# Patient Record
Sex: Female | Born: 1935 | Race: Black or African American | Hispanic: No
Health system: Southern US, Community
[De-identification: ages and names within clinical notes are randomized; demographics above are authoritative.]

---

## 1999-01-21 ENCOUNTER — Encounter: Payer: Self-pay | Admitting: Cardiology

## 1999-01-21 ENCOUNTER — Inpatient Hospital Stay (HOSPITAL_COMMUNITY): Admission: EM | Admit: 1999-01-21 | Discharge: 1999-01-23 | Payer: Self-pay | Admitting: Cardiology

## 1999-05-03 ENCOUNTER — Ambulatory Visit (HOSPITAL_COMMUNITY): Admission: RE | Admit: 1999-05-03 | Discharge: 1999-05-03 | Payer: Self-pay | Admitting: Cardiology

## 1999-05-03 ENCOUNTER — Encounter: Payer: Self-pay | Admitting: Cardiology

## 2000-09-19 ENCOUNTER — Ambulatory Visit (HOSPITAL_COMMUNITY): Admission: RE | Admit: 2000-09-19 | Discharge: 2000-09-19 | Payer: Self-pay | Admitting: Cardiology

## 2000-09-19 ENCOUNTER — Encounter: Payer: Self-pay | Admitting: Cardiology

## 2001-04-13 ENCOUNTER — Encounter: Payer: Self-pay | Admitting: Family Medicine

## 2001-04-13 ENCOUNTER — Ambulatory Visit (HOSPITAL_COMMUNITY): Admission: RE | Admit: 2001-04-13 | Discharge: 2001-04-13 | Payer: Self-pay | Admitting: Family Medicine

## 2001-08-12 ENCOUNTER — Other Ambulatory Visit: Admission: RE | Admit: 2001-08-12 | Discharge: 2001-08-12 | Payer: Self-pay | Admitting: Family Medicine

## 2001-11-24 ENCOUNTER — Encounter (HOSPITAL_COMMUNITY): Admission: RE | Admit: 2001-11-24 | Discharge: 2001-12-24 | Payer: Self-pay | Admitting: Oncology

## 2001-11-24 ENCOUNTER — Encounter: Admission: RE | Admit: 2001-11-24 | Discharge: 2001-11-24 | Payer: Self-pay | Admitting: Oncology

## 2001-11-25 ENCOUNTER — Encounter (HOSPITAL_COMMUNITY): Payer: Self-pay | Admitting: Oncology

## 2002-03-18 ENCOUNTER — Encounter: Payer: Self-pay | Admitting: General Surgery

## 2002-03-19 ENCOUNTER — Encounter: Payer: Self-pay | Admitting: General Surgery

## 2002-03-19 ENCOUNTER — Ambulatory Visit (HOSPITAL_COMMUNITY): Admission: RE | Admit: 2002-03-19 | Discharge: 2002-03-19 | Payer: Self-pay | Admitting: General Surgery

## 2002-04-12 ENCOUNTER — Encounter: Payer: Self-pay | Admitting: Family Medicine

## 2002-04-12 ENCOUNTER — Ambulatory Visit (HOSPITAL_COMMUNITY): Admission: RE | Admit: 2002-04-12 | Discharge: 2002-04-12 | Payer: Self-pay | Admitting: Family Medicine

## 2002-06-09 ENCOUNTER — Encounter: Payer: Self-pay | Admitting: Cardiology

## 2002-06-09 ENCOUNTER — Ambulatory Visit (HOSPITAL_COMMUNITY): Admission: RE | Admit: 2002-06-09 | Discharge: 2002-06-09 | Payer: Self-pay | Admitting: Cardiology

## 2003-02-11 ENCOUNTER — Ambulatory Visit (HOSPITAL_COMMUNITY): Admission: RE | Admit: 2003-02-11 | Discharge: 2003-02-11 | Payer: Self-pay | Admitting: Cardiology

## 2003-02-11 ENCOUNTER — Encounter: Payer: Self-pay | Admitting: Cardiology

## 2003-04-07 ENCOUNTER — Emergency Department (HOSPITAL_COMMUNITY): Admission: EM | Admit: 2003-04-07 | Discharge: 2003-04-07 | Payer: Self-pay | Admitting: Internal Medicine

## 2003-04-07 ENCOUNTER — Encounter: Payer: Self-pay | Admitting: Internal Medicine

## 2003-04-12 ENCOUNTER — Emergency Department (HOSPITAL_COMMUNITY): Admission: EM | Admit: 2003-04-12 | Discharge: 2003-04-12 | Payer: Self-pay | Admitting: *Deleted

## 2003-04-21 ENCOUNTER — Ambulatory Visit (HOSPITAL_COMMUNITY): Admission: RE | Admit: 2003-04-21 | Discharge: 2003-04-21 | Payer: Self-pay | Admitting: Family Medicine

## 2003-04-21 ENCOUNTER — Emergency Department (HOSPITAL_COMMUNITY): Admission: EM | Admit: 2003-04-21 | Discharge: 2003-04-21 | Payer: Self-pay | Admitting: Emergency Medicine

## 2003-04-21 ENCOUNTER — Encounter: Payer: Self-pay | Admitting: Family Medicine

## 2003-05-29 ENCOUNTER — Emergency Department (HOSPITAL_COMMUNITY): Admission: EM | Admit: 2003-05-29 | Discharge: 2003-05-29 | Payer: Self-pay | Admitting: Emergency Medicine

## 2003-05-29 ENCOUNTER — Encounter: Payer: Self-pay | Admitting: Emergency Medicine

## 2003-06-13 ENCOUNTER — Encounter: Payer: Self-pay | Admitting: Family Medicine

## 2003-06-13 ENCOUNTER — Emergency Department (HOSPITAL_COMMUNITY): Admission: RE | Admit: 2003-06-13 | Discharge: 2003-06-13 | Payer: Self-pay | Admitting: Family Medicine

## 2003-06-13 ENCOUNTER — Encounter: Payer: Self-pay | Admitting: Emergency Medicine

## 2003-09-05 ENCOUNTER — Inpatient Hospital Stay (HOSPITAL_COMMUNITY): Admission: AD | Admit: 2003-09-05 | Discharge: 2003-09-08 | Payer: Self-pay | Admitting: General Surgery

## 2004-04-19 ENCOUNTER — Ambulatory Visit (HOSPITAL_COMMUNITY): Admission: RE | Admit: 2004-04-19 | Discharge: 2004-04-19 | Payer: Self-pay | Admitting: General Surgery

## 2004-04-23 ENCOUNTER — Ambulatory Visit (HOSPITAL_COMMUNITY): Admission: RE | Admit: 2004-04-23 | Discharge: 2004-04-23 | Payer: Self-pay | Admitting: General Surgery

## 2004-06-14 ENCOUNTER — Ambulatory Visit (HOSPITAL_COMMUNITY): Admission: RE | Admit: 2004-06-14 | Discharge: 2004-06-14 | Payer: Self-pay | Admitting: General Surgery

## 2004-10-22 ENCOUNTER — Ambulatory Visit (HOSPITAL_COMMUNITY): Admission: RE | Admit: 2004-10-22 | Discharge: 2004-10-22 | Payer: Self-pay | Admitting: Family Medicine

## 2005-05-06 ENCOUNTER — Emergency Department (HOSPITAL_COMMUNITY): Admission: EM | Admit: 2005-05-06 | Discharge: 2005-05-06 | Payer: Self-pay | Admitting: Emergency Medicine

## 2005-09-05 ENCOUNTER — Ambulatory Visit (HOSPITAL_COMMUNITY): Admission: RE | Admit: 2005-09-05 | Discharge: 2005-09-05 | Payer: Self-pay | Admitting: Family Medicine

## 2006-10-31 ENCOUNTER — Ambulatory Visit (HOSPITAL_COMMUNITY): Admission: RE | Admit: 2006-10-31 | Discharge: 2006-10-31 | Payer: Self-pay | Admitting: Family Medicine

## 2006-12-18 ENCOUNTER — Emergency Department (HOSPITAL_COMMUNITY): Admission: EM | Admit: 2006-12-18 | Discharge: 2006-12-18 | Payer: Self-pay | Admitting: Emergency Medicine

## 2006-12-21 ENCOUNTER — Inpatient Hospital Stay (HOSPITAL_COMMUNITY): Admission: EM | Admit: 2006-12-21 | Discharge: 2007-01-01 | Payer: Self-pay | Admitting: Emergency Medicine

## 2006-12-21 ENCOUNTER — Ambulatory Visit: Payer: Self-pay | Admitting: Cardiology

## 2007-08-27 ENCOUNTER — Ambulatory Visit (HOSPITAL_COMMUNITY): Admission: RE | Admit: 2007-08-27 | Discharge: 2007-08-27 | Payer: Self-pay | Admitting: Internal Medicine

## 2007-08-27 ENCOUNTER — Ambulatory Visit: Payer: Self-pay | Admitting: Internal Medicine

## 2007-10-22 IMAGING — CR DG ABDOMEN ACUTE W/ 1V CHEST
3 series · 3 of 3 positions shown · non-contrast
Comparison: none

CLINICAL DATA: Generalized weakness with abdominal pain.  
 ACUTE ABDOMINAL SERIES:

[view not recorded (1 of 3)]
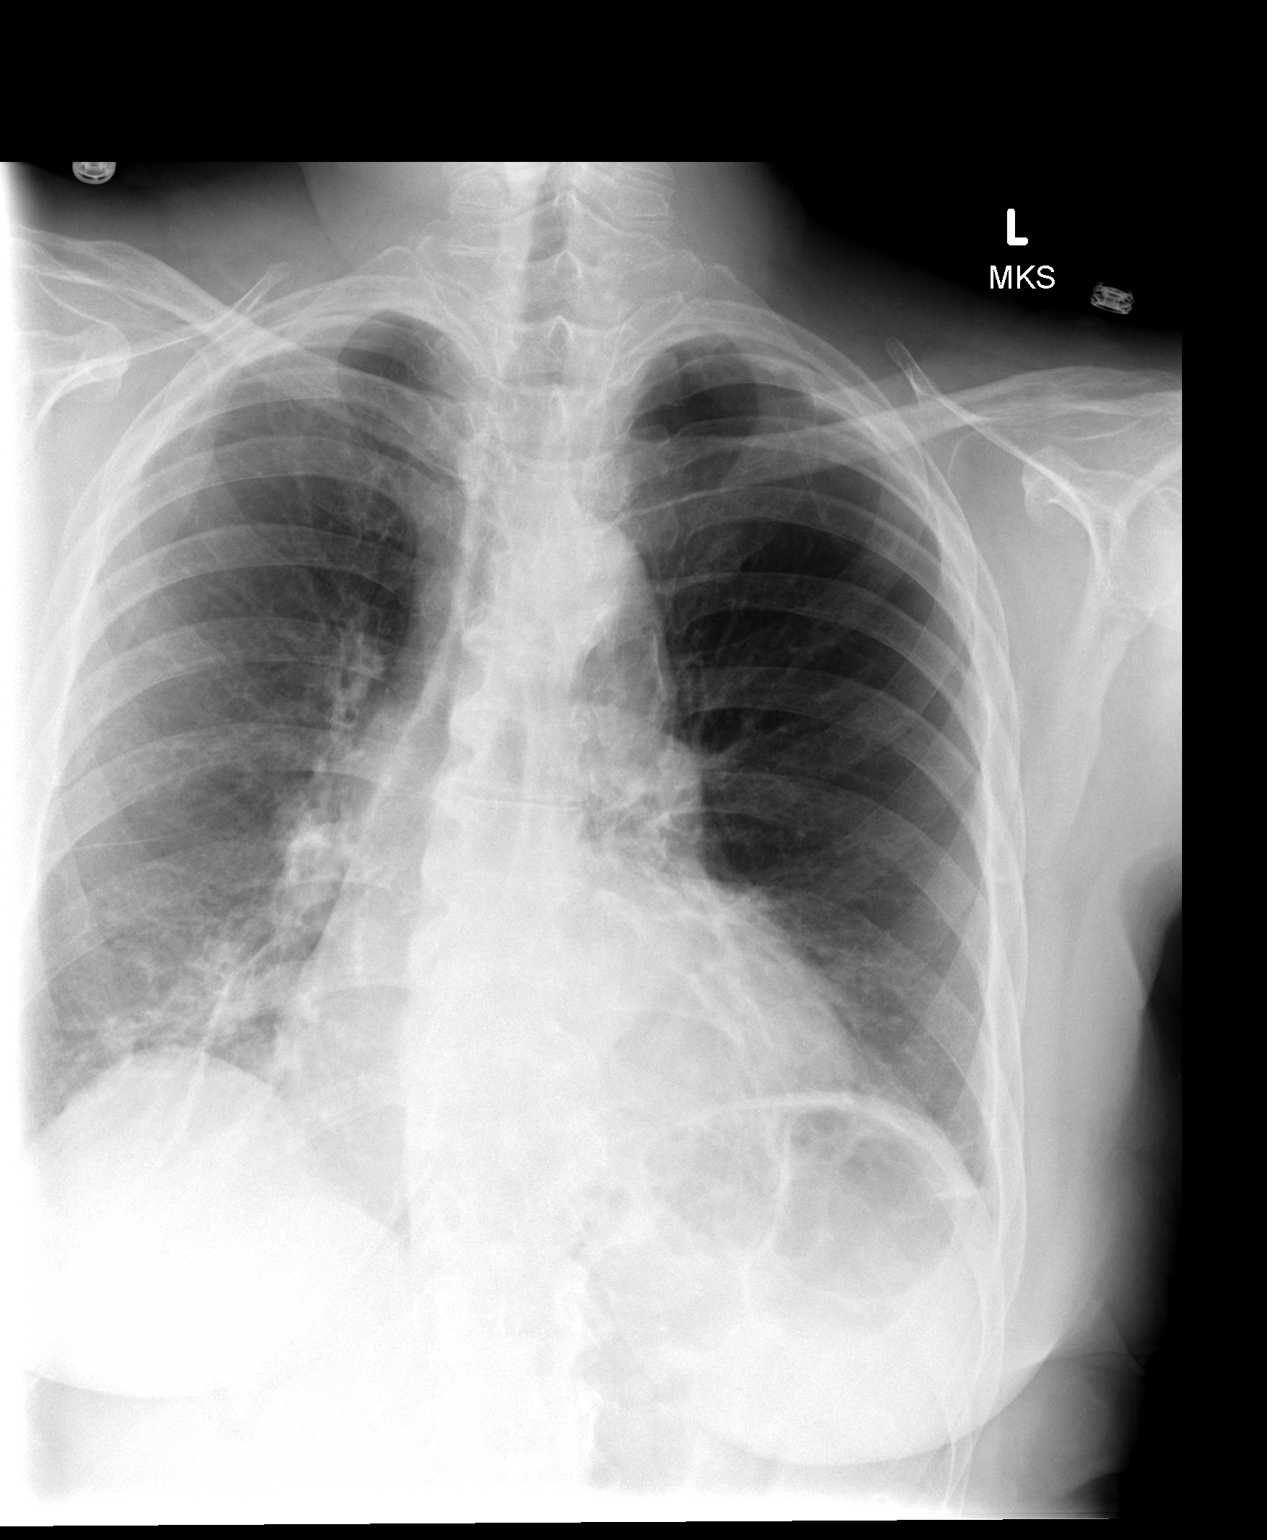

[view not recorded (2 of 3)]
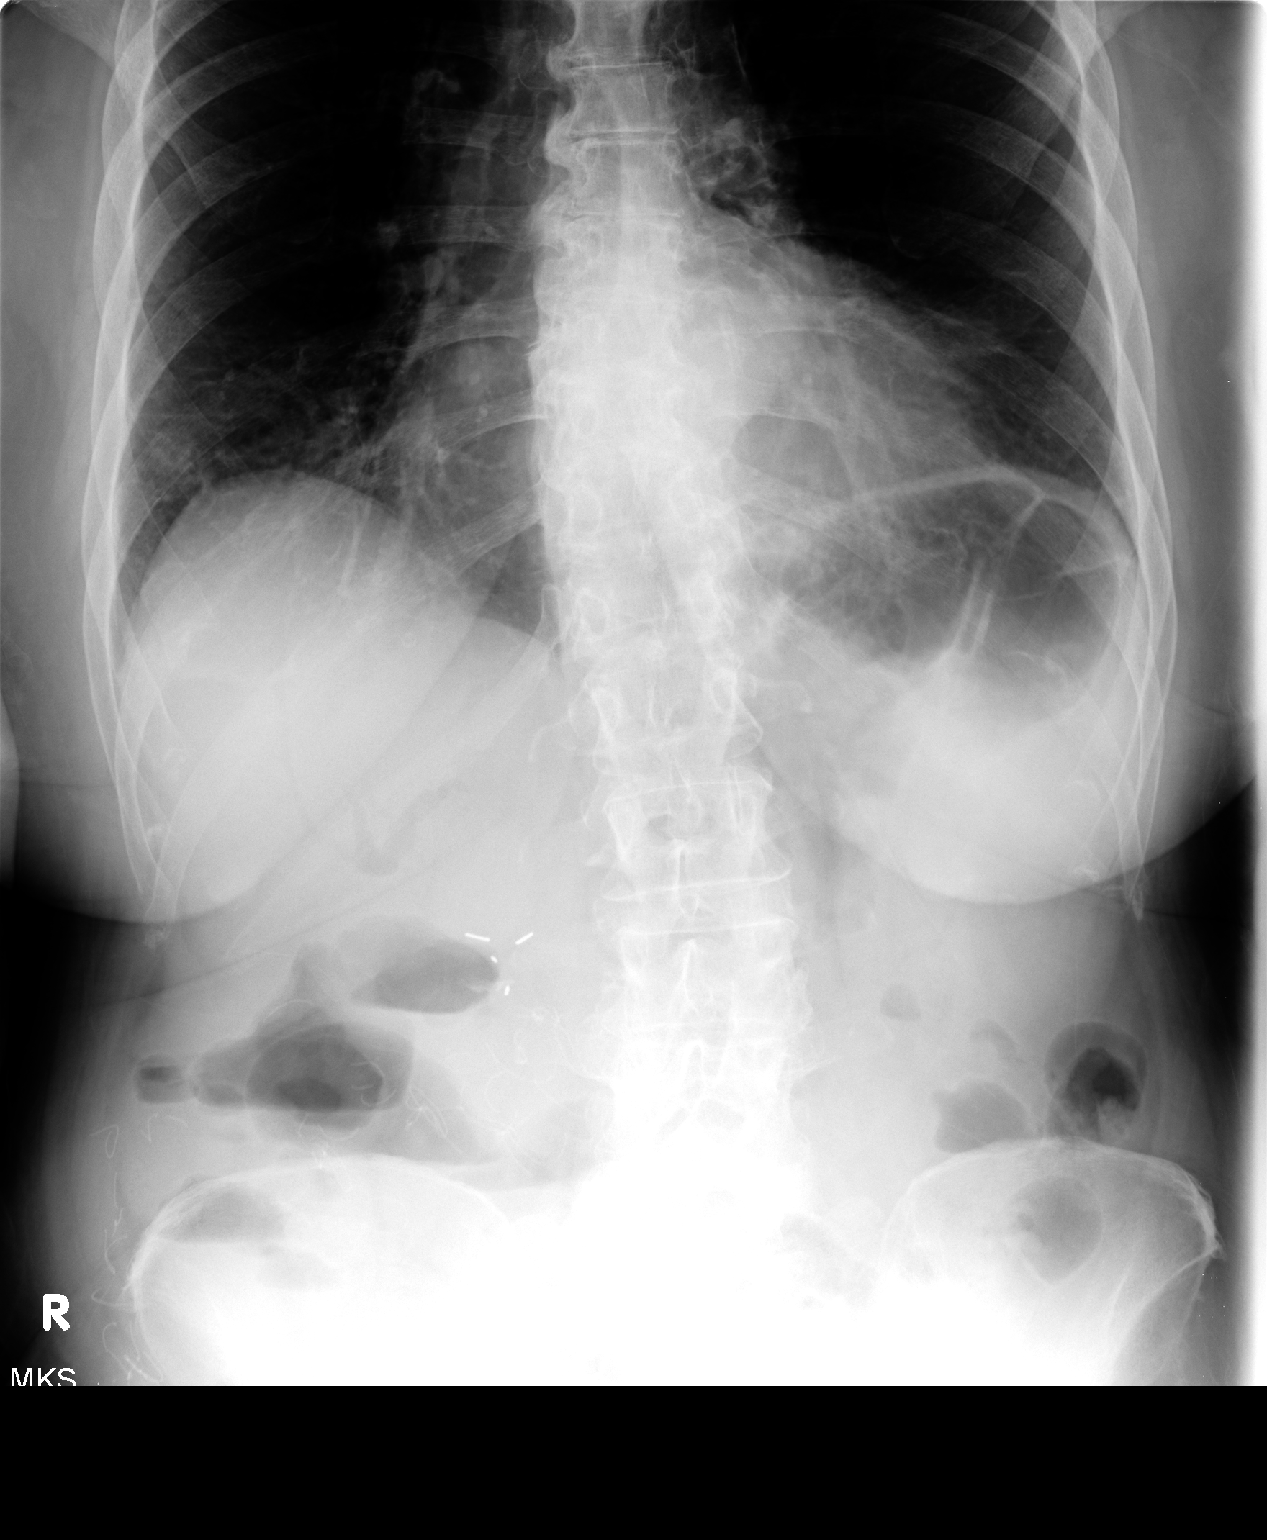

[view not recorded (3 of 3)]
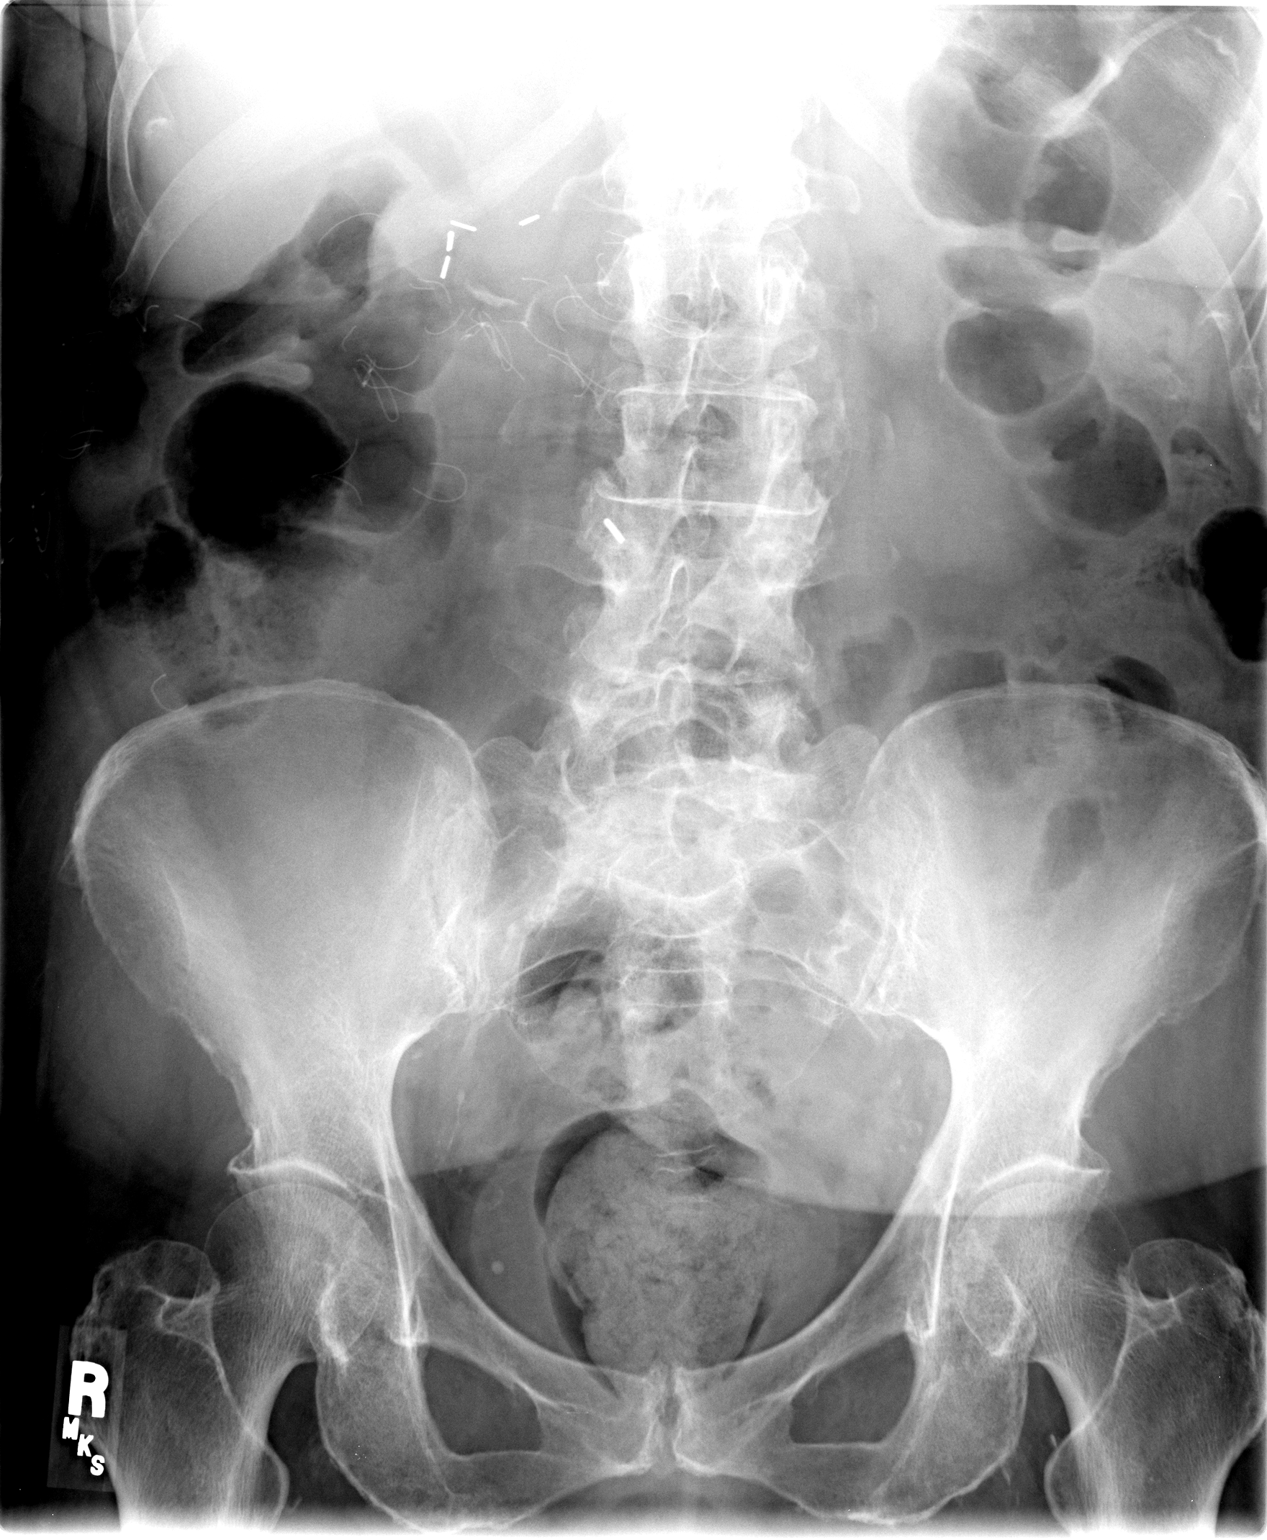

[3 of 3 positions shown; findings below may reference images not displayed]

FINDINGS: Nonobstructive bowel gas pattern.  Inspissated fecal material within the rectum likely fecal impaction.  Coarse interstitial densities within the lung bases right greater than left.  Question developing inflammatory process superimposed on chronic changes.
IMPRESSION: Question developing infiltrate right lower lung.

## 2007-10-24 IMAGING — CR DG CHEST 2V
2 series · 2 of 2 positions shown · non-contrast
Comparison: none

HISTORY: Generalized weakness, pneumonia

[view not recorded (1 of 2)]
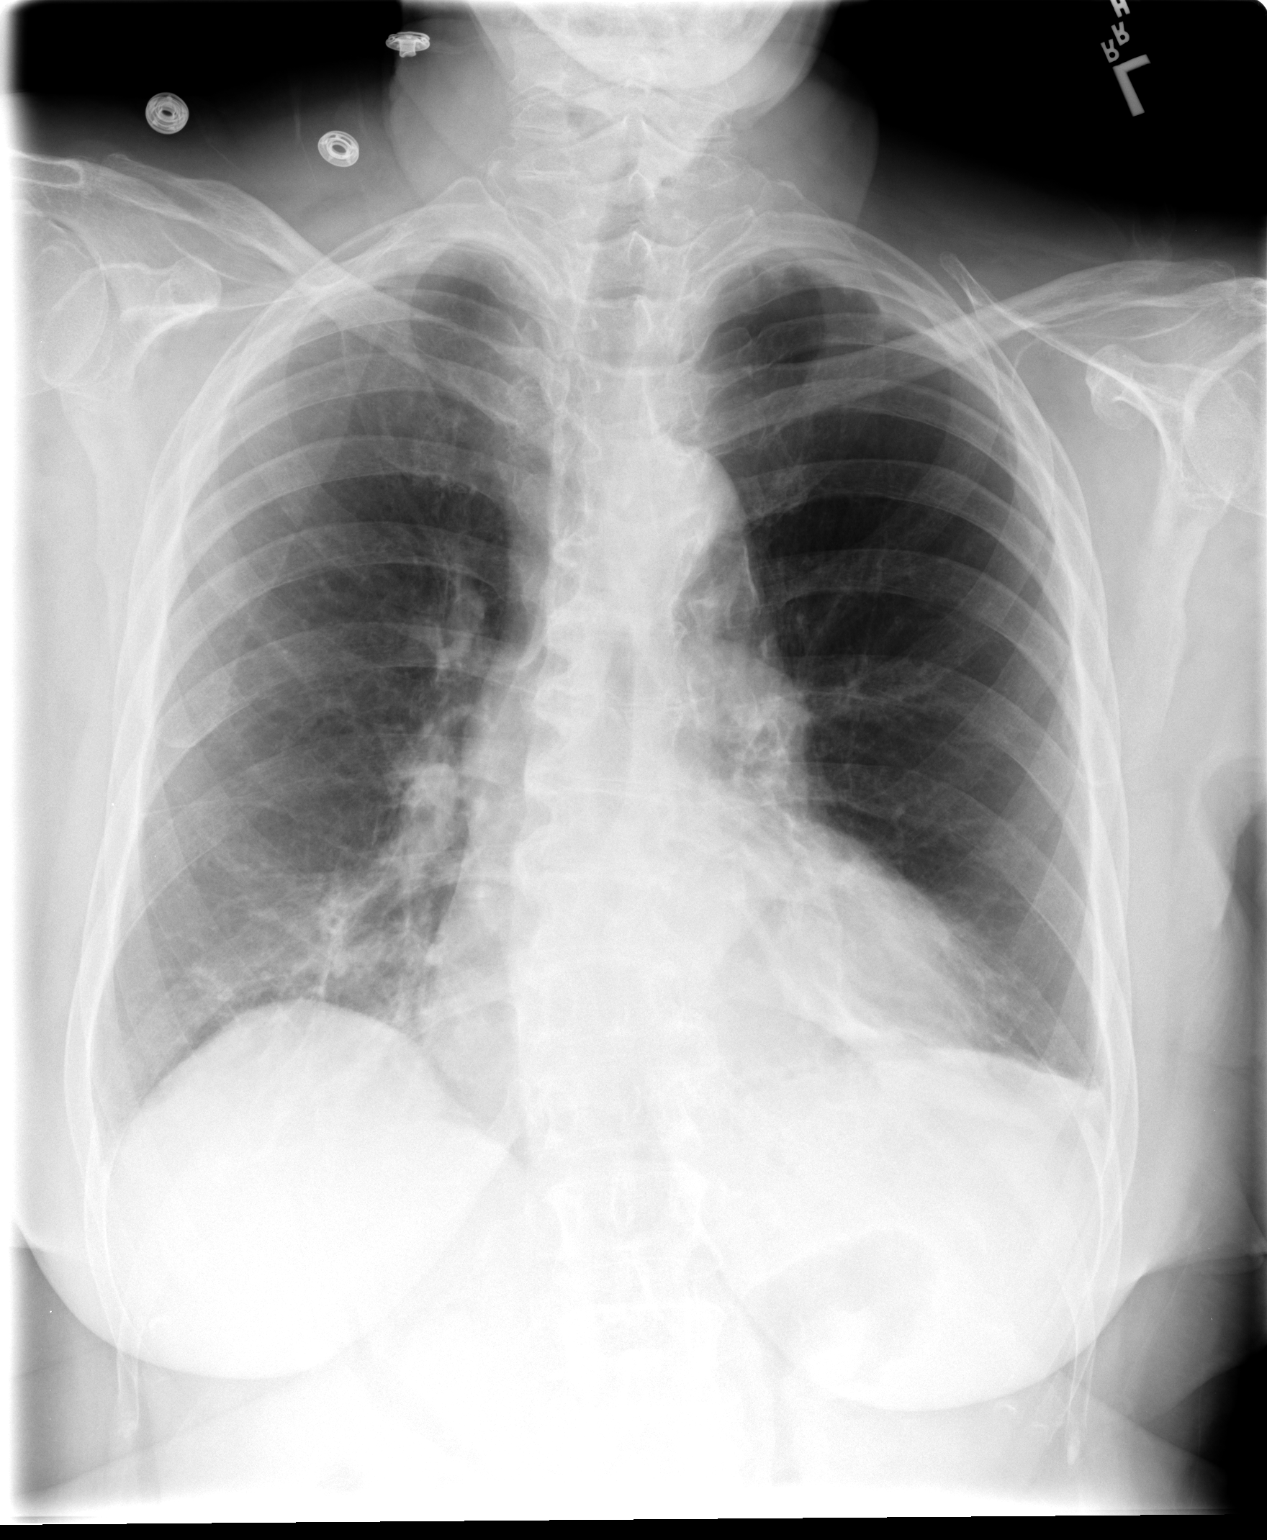

[view not recorded (2 of 2)]
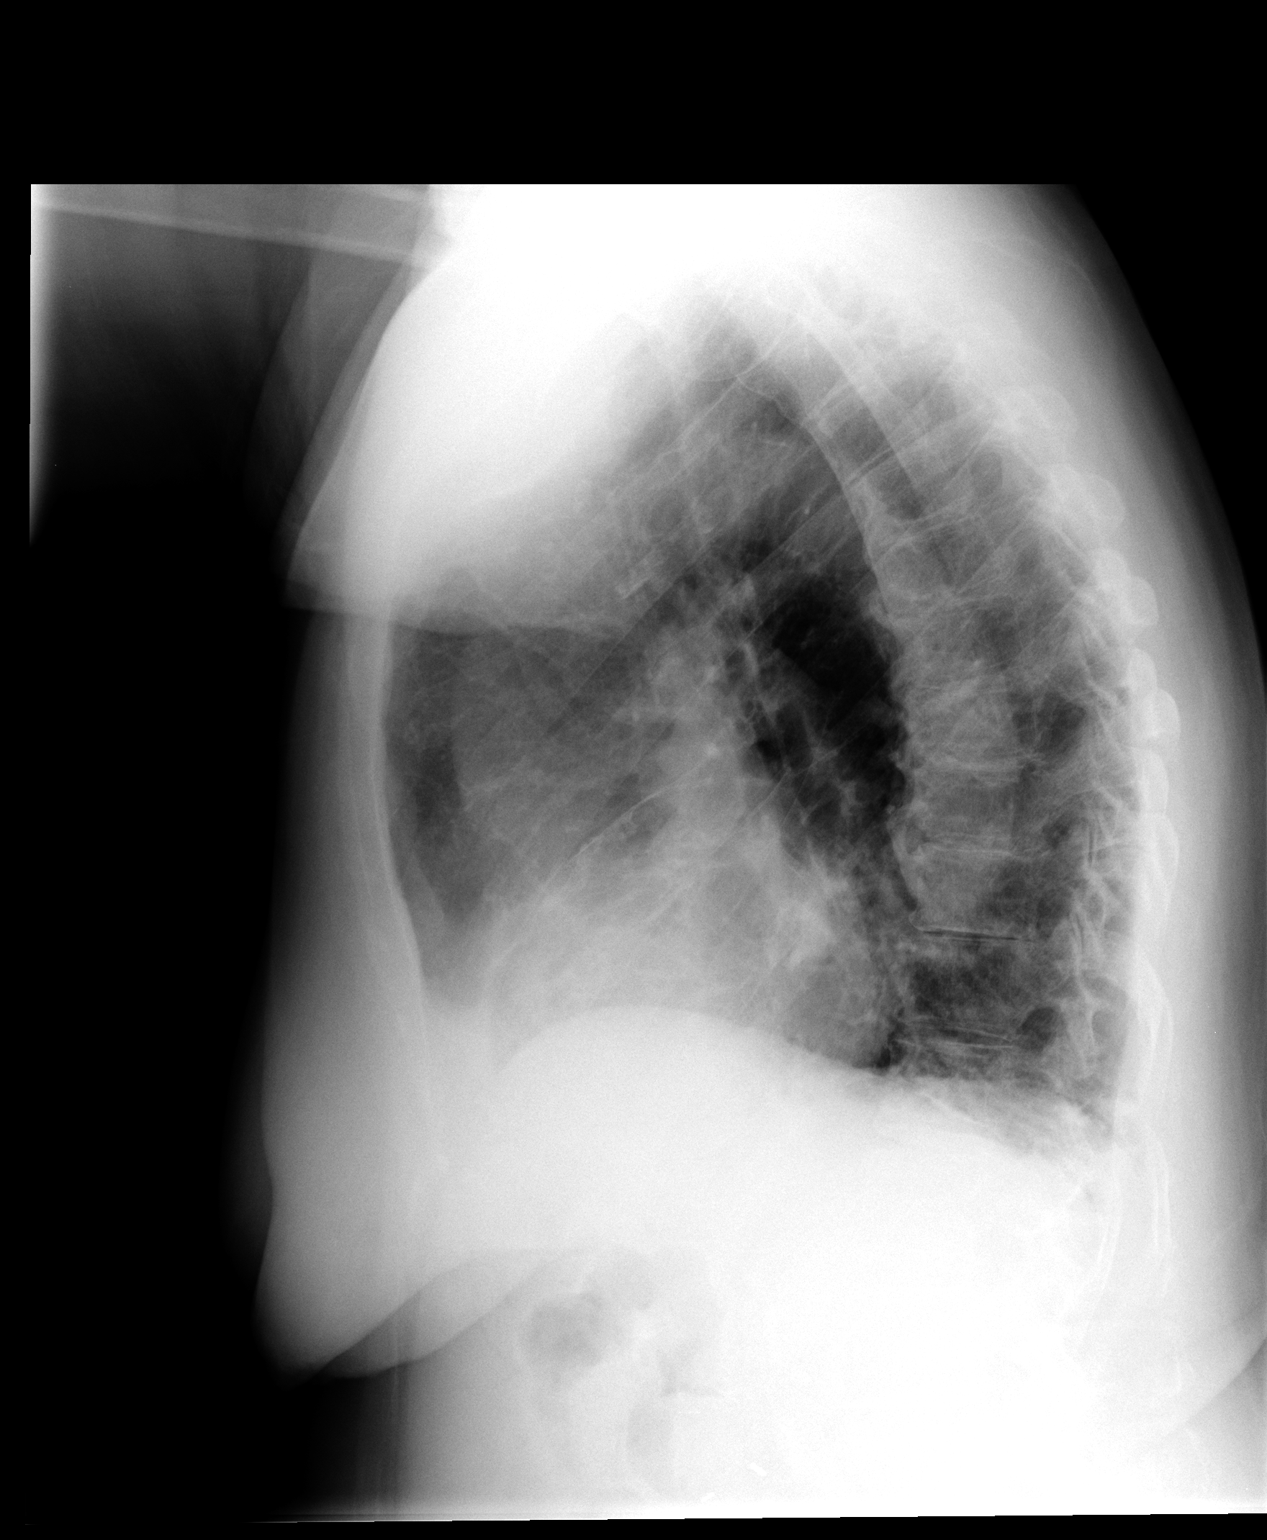

[2 of 2 positions shown; findings below may reference images not displayed]

CHEST 2 VIEWS:

Comparison 12/21/2006

Cardiac enlargement with calcified tortuous aorta.
Pulmonary vascularity normal.
Subsegmental atelectasis at left lower lobe.
Mild infiltrate at right base.
Persistent bronchitic changes with underlying COPD.
Biapical scarring stable.
No definite pleural effusion or pneumothorax.
Degenerative disc disease changes spine.
IMPRESSION: Cardiomegaly with bronchitic changes.
Persistent bibasilar atelectasis with mild infiltrate in right lower lobe.
Improved lung volumes since previous study.

## 2009-12-05 DEATH — deceased

## 2010-01-09 ENCOUNTER — Inpatient Hospital Stay (HOSPITAL_COMMUNITY): Admission: EM | Admit: 2010-01-09 | Discharge: 2010-01-19 | Payer: Self-pay | Admitting: Emergency Medicine

## 2010-02-01 ENCOUNTER — Telehealth (INDEPENDENT_AMBULATORY_CARE_PROVIDER_SITE_OTHER): Payer: Self-pay

## 2010-02-19 ENCOUNTER — Inpatient Hospital Stay (HOSPITAL_COMMUNITY): Admission: EM | Admit: 2010-02-19 | Discharge: 2010-02-23 | Payer: Self-pay | Admitting: Emergency Medicine

## 2010-02-23 ENCOUNTER — Institutional Professional Consult (permissible substitution): Admission: RE | Admit: 2010-02-23 | Discharge: 2010-03-16 | Payer: Self-pay | Admitting: Internal Medicine

## 2010-02-23 ENCOUNTER — Ambulatory Visit (HOSPITAL_COMMUNITY): Admission: RE | Admit: 2010-02-23 | Discharge: 2010-02-23 | Payer: Self-pay | Admitting: Internal Medicine

## 2010-02-23 DIAGNOSIS — D509 Iron deficiency anemia, unspecified: Secondary | ICD-10-CM

## 2010-02-23 DIAGNOSIS — J189 Pneumonia, unspecified organism: Secondary | ICD-10-CM

## 2010-02-23 DIAGNOSIS — L89109 Pressure ulcer of unspecified part of back, unspecified stage: Secondary | ICD-10-CM | POA: Insufficient documentation

## 2010-03-20 ENCOUNTER — Ambulatory Visit (HOSPITAL_COMMUNITY): Admission: RE | Admit: 2010-03-20 | Discharge: 2010-03-20 | Payer: Self-pay | Admitting: Internal Medicine

## 2010-03-26 ENCOUNTER — Encounter (INDEPENDENT_AMBULATORY_CARE_PROVIDER_SITE_OTHER): Payer: Self-pay | Admitting: *Deleted

## 2010-03-26 ENCOUNTER — Inpatient Hospital Stay (HOSPITAL_COMMUNITY): Admission: EM | Admit: 2010-03-26 | Discharge: 2010-03-30 | Payer: Self-pay | Admitting: Emergency Medicine

## 2010-03-26 DIAGNOSIS — E785 Hyperlipidemia, unspecified: Secondary | ICD-10-CM

## 2010-03-26 DIAGNOSIS — F068 Other specified mental disorders due to known physiological condition: Secondary | ICD-10-CM

## 2010-03-26 DIAGNOSIS — E119 Type 2 diabetes mellitus without complications: Secondary | ICD-10-CM

## 2010-05-04 DEATH — deceased

## 2010-11-25 ENCOUNTER — Encounter: Payer: Self-pay | Admitting: Internal Medicine

## 2010-12-04 NOTE — Miscellaneous (Signed)
Summary: Problems, Medications and Allergies updated  Clinical Lists Changes  Problems: Added new problem of DECUBITUS ULCER, SACRUM (ICD-707.03) - infected Added new problem of DEMENTIA, ADVANCED (ICD-294.8) Added new problem of PNEUMONIA (ICD-486) Added new problem of ANEMIA, IRON DEFICIENCY (ICD-280.9) Added new problem of DIABETES MELLITUS (ICD-250.00) Added new problem of HYPERLIPIDEMIA (ICD-272.4) Medications: Added new medication of PROTONIX 40 MG TBEC (PANTOPRAZOLE SODIUM) Take 1 tablet by mouth once a day per PCP Added new medication of NORVASC 10 MG TABS (AMLODIPINE BESYLATE) Take 1 tablet by mouth once a day per PCP Added new medication of ASPIRIN 81 MG TABS (ASPIRIN) Take 1 tablet by mouth once a day per PCP Added new medication of COREG 25 MG TABS (CARVEDILOL) Take 1 tablet by mouth once a day per PCP Added new medication of NAMENDA 5 MG TABS (MEMANTINE HCL) Take 1 tablet by mouth once a day per PCP Added new medication of NAMENDA 10 MG TABS (MEMANTINE HCL) Take 1 tablet by mouth once a day per PCP Added new medication of SIMVASTATIN 10 MG TABS (SIMVASTATIN) Take 1 tablet by mouth once a day per PCP Added new medication of ATROVENT 0.03 % SOLN (IPRATROPIUM BROMIDE) Per nebulizer every 4 hours as needed per PCP Added new medication of ALBUTEROL SULFATE (2.5 MG/3ML) 0.083% NEBU (ALBUTEROL SULFATE) every 4 hours as needed by nebulizer per PCP Added new medication of PROTEIN SUPPLEMENT 80%  POWD (NUTRITIONAL SUPPLEMENTS) 6 grams by mouth three times a day per PCP Added new medication of ZOSYN 4-0.5 GM/100ML SOLN (PIPERACILLIN-TAZOBACTAM IN D5W) 3.375 grams IV every 8 hours Added new medication of VANCOMYCIN HCL 1000 MG SOLR (VANCOMYCIN HCL) per pharmacy protocol Added new medication of ACETAMINOPHEN 650 MG CR-TABS (ACETAMINOPHEN) Take 1 tablet by mouth every 6 hours as needed for pain per PCP Added new medication of ZOFRAN 4 MG TABS (ONDANSETRON HCL) Take 1 tablet by mouth every 4  hours as needed per PCP Added new medication of MORPHINE SULFATE 2 MG/ML SOLN (MORPHINE SULFATE) 2 mg IV during dressing change Observations: Added new observation of NKA: T (03/26/2010 17:23)

## 2010-12-04 NOTE — Progress Notes (Signed)
Summary: family wants peg removed  Phone Note Call from Patient Call back at Home Phone [phone]   Caller: Nursing Home Summary of Call: Lurena Joiner from [facility] called- this pt came to them from the hospital. Dr. Felecia Shelling has cut down her tube feedings and now the tube is getting clogged and causing abd pain. The pt is taking food and liquids by mouth. Dr. Felecia Shelling and the family are requesting the tube to be taken out. please advise. Initial call taken by: Hendricks Limes LPN,  February 01, 2010 2:14 PM     Appended Document: family wants peg removed paged RMR  Appended Document: family wants peg removed pt to come with family to short stay for assesement 4/1  Appended Document: family wants peg removed spoke with Lurena Joiner at [facility]- tube came out on 2nd shift last night. dressing was applied and there is no drainage reported.   Appended Document: family wants peg removed noted; then we don't need to do anything as pt / family/ Dr. wanted it out anyway.  Appended Document: family wants peg removed Lurena Joiner at [facility] aware that we dont need to do anything else. will watch pt for s/s of infection.

## 2011-01-17 IMAGING — CR DG CHEST 1V PORT
1 series · 1 of 1 positions shown · non-contrast
Comparison: 02/24/2010

CLINICAL DATA: Altered LOC.

PORTABLE CHEST - 1 VIEW

[view not recorded]
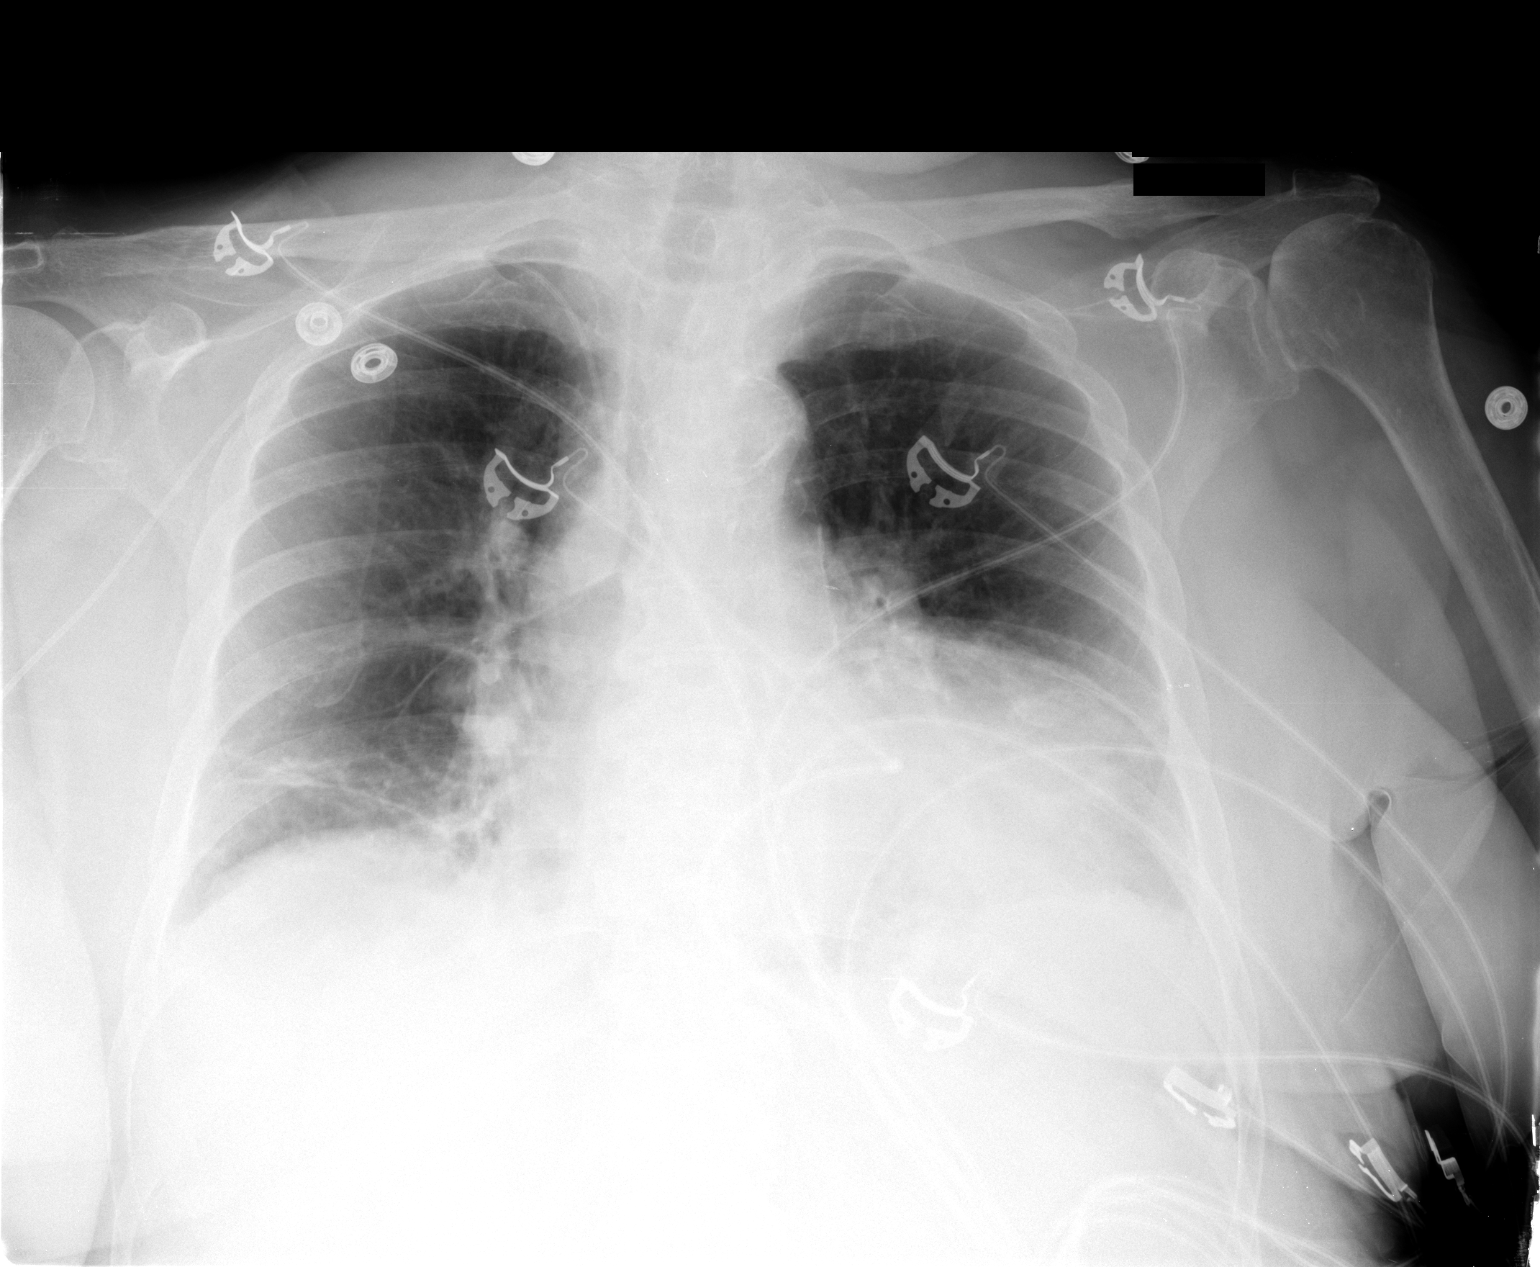

[1 of 1 positions shown; findings below may reference images not displayed]

FINDINGS: Linear scarring and/or subsegmental atelectasis at the
right lung base.  No infiltrates.  Right upper extremity PICC line
is in the upper SVC.  Cardiomegaly.
IMPRESSION: Right base scarring and/or subsegmental atelectasis at the right
base.

## 2011-01-21 LAB — COMPREHENSIVE METABOLIC PANEL
ALT: 10 U/L (ref 0–35)
Calcium: 9.1 mg/dL (ref 8.4–10.5)
Chloride: 116 mEq/L — ABNORMAL HIGH (ref 96–112)
GFR calc Af Amer: 45 mL/min — ABNORMAL LOW (ref >60)
GFR calc non Af Amer: 37 mL/min — ABNORMAL LOW (ref >60)
Potassium: 4 mEq/L (ref 3.5–5.1)
Sodium: 145 mEq/L (ref 135–145)
Total Bilirubin: 0.5 mg/dL (ref 0.3–1.2)

## 2011-01-21 LAB — DIFFERENTIAL
Basophils Absolute: 0.1 10*3/uL (ref 0.0–0.1)
Basophils Relative: 1 % (ref 0–1)
Lymphocytes Relative: 20 % (ref 12–46)
Lymphs Abs: 2.2 10*3/uL (ref 0.7–4.0)
Monocytes Absolute: 0.4 10*3/uL (ref 0.1–1.0)
Monocytes Relative: 4 % (ref 3–12)
Neutro Abs: 7.6 10*3/uL (ref 1.7–7.7)

## 2011-01-21 LAB — CBC
HCT: 30.2 % — ABNORMAL LOW (ref 36.0–46.0)
HCT: 33.7 % — ABNORMAL LOW (ref 36.0–46.0)
Hemoglobin: 10 g/dL — ABNORMAL LOW (ref 12.0–15.0)
Hemoglobin: 9.8 g/dL — ABNORMAL LOW (ref 12.0–15.0)
MCHC: 33.1 g/dL (ref 30.0–36.0)
MCV: 77.4 fL — ABNORMAL LOW (ref 78.0–100.0)
MCV: 77.9 fL — ABNORMAL LOW (ref 78.0–100.0)
RBC: 3.84 MIL/uL — ABNORMAL LOW (ref 3.87–5.11)
RBC: 4.33 MIL/uL (ref 3.87–5.11)
RDW: 21.3 % — ABNORMAL HIGH (ref 11.5–15.5)
WBC: 11.9 10*3/uL — ABNORMAL HIGH (ref 4.0–10.5)

## 2011-01-21 LAB — PROTIME-INR
INR: 1.23 (ref 0.00–1.49)
Prothrombin Time: 15.4 seconds — ABNORMAL HIGH (ref 11.6–15.2)

## 2011-01-21 LAB — URINALYSIS, ROUTINE W REFLEX MICROSCOPIC
Bilirubin Urine: NEGATIVE
Nitrite: NEGATIVE
Protein, ur: NEGATIVE mg/dL
pH: 5 (ref 5.0–8.0)

## 2011-01-21 LAB — HEMOGLOBIN A1C
Hgb A1c MFr Bld: 5.5 % (ref <5.7)
Mean Plasma Glucose: 111 mg/dL (ref <117)

## 2011-01-21 LAB — GLUCOSE, CAPILLARY
Glucose-Capillary: 141 mg/dL — ABNORMAL HIGH (ref 70–99)
Glucose-Capillary: 79 mg/dL (ref 70–99)

## 2011-01-21 LAB — BASIC METABOLIC PANEL
CO2: 23 mEq/L (ref 19–32)
Calcium: 8.7 mg/dL (ref 8.4–10.5)
Chloride: 124 mEq/L — ABNORMAL HIGH (ref 96–112)
GFR calc Af Amer: >60 mL/min (ref >60)
GFR calc non Af Amer: 52 mL/min — ABNORMAL LOW (ref >60)
GFR calc non Af Amer: 55 mL/min — ABNORMAL LOW (ref >60)
Glucose, Bld: 103 mg/dL — ABNORMAL HIGH (ref 70–99)
Potassium: 3.3 mEq/L — ABNORMAL LOW (ref 3.5–5.1)
Potassium: 3.6 mEq/L (ref 3.5–5.1)
Sodium: 150 mEq/L — ABNORMAL HIGH (ref 135–145)
Sodium: 150 mEq/L — ABNORMAL HIGH (ref 135–145)

## 2011-01-21 LAB — POCT I-STAT 3, ART BLOOD GAS (G3+)
Acid-base deficit: 3 mmol/L — ABNORMAL HIGH (ref 0.0–2.0)
Bicarbonate: 22.2 mEq/L (ref 20.0–24.0)
pH, Arterial: 7.367 (ref 7.350–7.400)

## 2011-01-21 LAB — URINE CULTURE: Colony Count: >=100000

## 2011-01-21 LAB — URINE MICROSCOPIC-ADD ON

## 2011-01-21 LAB — CULTURE, BLOOD (ROUTINE X 2)

## 2011-01-21 LAB — CK TOTAL AND CKMB (NOT AT ARMC): Relative Index: INVALID (ref 0.0–2.5)

## 2011-01-21 LAB — WOUND CULTURE

## 2011-01-21 LAB — LACTIC ACID, PLASMA: Lactic Acid, Venous: 1.1 mmol/L (ref 0.5–2.2)

## 2011-01-22 LAB — COMPREHENSIVE METABOLIC PANEL
ALT: 8 U/L (ref 0–35)
ALT: 12 U/L (ref 0–35)
AST: 19 U/L (ref 0–37)
Albumin: 1.7 g/dL — ABNORMAL LOW (ref 3.5–5.2)
Alkaline Phosphatase: 78 U/L (ref 39–117)
Alkaline Phosphatase: 71 U/L (ref 39–117)
BUN: 7 mg/dL (ref 6–23)
CO2: 24 mEq/L (ref 19–32)
Calcium: 9.1 mg/dL (ref 8.4–10.5)
Calcium: 8.6 mg/dL (ref 8.4–10.5)
Chloride: 113 mEq/L — ABNORMAL HIGH (ref 96–112)
Creatinine, Ser: 1.01 mg/dL (ref 0.4–1.2)
GFR calc Af Amer: >60 mL/min (ref >60)
GFR calc non Af Amer: 54 mL/min — ABNORMAL LOW (ref >60)
Glucose, Bld: 125 mg/dL — ABNORMAL HIGH (ref 70–99)
Glucose, Bld: 148 mg/dL — ABNORMAL HIGH (ref 70–99)
Potassium: 3.3 mEq/L — ABNORMAL LOW (ref 3.5–5.1)
Potassium: 3.7 mEq/L (ref 3.5–5.1)
Sodium: 143 mEq/L (ref 135–145)
Sodium: 143 mEq/L (ref 135–145)
Total Bilirubin: 0.4 mg/dL (ref 0.3–1.2)
Total Bilirubin: 0.6 mg/dL (ref 0.3–1.2)
Total Protein: 6.2 g/dL (ref 6.0–8.3)
Total Protein: 6.8 g/dL (ref 6.0–8.3)

## 2011-01-22 LAB — GLUCOSE, CAPILLARY
Glucose-Capillary: 100 mg/dL — ABNORMAL HIGH (ref 70–99)
Glucose-Capillary: 104 mg/dL — ABNORMAL HIGH (ref 70–99)
Glucose-Capillary: 118 mg/dL — ABNORMAL HIGH (ref 70–99)
Glucose-Capillary: 122 mg/dL — ABNORMAL HIGH (ref 70–99)
Glucose-Capillary: 130 mg/dL — ABNORMAL HIGH (ref 70–99)
Glucose-Capillary: 135 mg/dL — ABNORMAL HIGH (ref 70–99)
Glucose-Capillary: 94 mg/dL (ref 70–99)

## 2011-01-22 LAB — BASIC METABOLIC PANEL
BUN: 9 mg/dL (ref 6–23)
BUN: 8 mg/dL (ref 6–23)
CO2: 23 mEq/L (ref 19–32)
CO2: 24 mEq/L (ref 19–32)
CO2: 23 mEq/L (ref 19–32)
Calcium: 8.4 mg/dL (ref 8.4–10.5)
Calcium: 8.4 mg/dL (ref 8.4–10.5)
Chloride: 115 mEq/L — ABNORMAL HIGH (ref 96–112)
Chloride: 117 mEq/L — ABNORMAL HIGH (ref 96–112)
Chloride: 121 mEq/L — ABNORMAL HIGH (ref 96–112)
Chloride: 113 mEq/L — ABNORMAL HIGH (ref 96–112)
Creatinine, Ser: 0.74 mg/dL (ref 0.4–1.2)
Creatinine, Ser: 1.03 mg/dL (ref 0.4–1.2)
Creatinine, Ser: 1.17 mg/dL (ref 0.4–1.2)
Creatinine, Ser: 0.94 mg/dL (ref 0.4–1.2)
Creatinine, Ser: 1.04 mg/dL (ref 0.4–1.2)
GFR calc Af Amer: >60 mL/min (ref >60)
GFR calc Af Amer: >60 mL/min (ref >60)
GFR calc Af Amer: >60 mL/min (ref >60)
GFR calc Af Amer: >60 mL/min (ref >60)
GFR calc non Af Amer: 52 mL/min — ABNORMAL LOW (ref >60)
GFR calc non Af Amer: 50 mL/min — ABNORMAL LOW (ref >60)
Glucose, Bld: 122 mg/dL — ABNORMAL HIGH (ref 70–99)
Glucose, Bld: 92 mg/dL (ref 70–99)
Potassium: 3.1 mEq/L — ABNORMAL LOW (ref 3.5–5.1)
Potassium: 4 mEq/L (ref 3.5–5.1)
Sodium: 146 mEq/L — ABNORMAL HIGH (ref 135–145)
Sodium: 147 mEq/L — ABNORMAL HIGH (ref 135–145)

## 2011-01-22 LAB — DIFFERENTIAL
Basophils Absolute: 0.1 10*3/uL (ref 0.0–0.1)
Basophils Absolute: 0.2 10*3/uL — ABNORMAL HIGH (ref 0.0–0.1)
Basophils Absolute: 0.1 10*3/uL (ref 0.0–0.1)
Basophils Relative: 1 % (ref 0–1)
Basophils Relative: 0 % (ref 0–1)
Basophils Relative: 1 % (ref 0–1)
Eosinophils Absolute: 0.8 10*3/uL — ABNORMAL HIGH (ref 0.0–0.7)
Eosinophils Absolute: 0.3 10*3/uL (ref 0.0–0.7)
Eosinophils Absolute: 0.4 10*3/uL (ref 0.0–0.7)
Eosinophils Relative: 7 % — ABNORMAL HIGH (ref 0–5)
Lymphocytes Relative: 24 % (ref 12–46)
Lymphs Abs: 2.4 10*3/uL (ref 0.7–4.0)
Lymphs Abs: 3.2 10*3/uL (ref 0.7–4.0)
Lymphs Abs: 3.1 10*3/uL (ref 0.7–4.0)
Monocytes Absolute: 0.5 10*3/uL (ref 0.1–1.0)
Monocytes Absolute: 0.6 10*3/uL (ref 0.1–1.0)
Monocytes Absolute: 0.4 10*3/uL (ref 0.1–1.0)
Monocytes Relative: 5 % (ref 3–12)
Monocytes Relative: 5 % (ref 3–12)
Monocytes Relative: 3 % (ref 3–12)
Neutro Abs: 12.5 10*3/uL — ABNORMAL HIGH (ref 1.7–7.7)
Neutro Abs: 6.1 10*3/uL (ref 1.7–7.7)
Neutro Abs: 7.1 10*3/uL (ref 1.7–7.7)
Neutro Abs: 9.1 10*3/uL — ABNORMAL HIGH (ref 1.7–7.7)
Neutro Abs: 9.8 10*3/uL — ABNORMAL HIGH (ref 1.7–7.7)
Neutrophils Relative %: 60 % (ref 43–77)
Neutrophils Relative %: 74 % (ref 43–77)
Neutrophils Relative %: 68 % (ref 43–77)
Neutrophils Relative %: 70 % (ref 43–77)
Neutrophils Relative %: 74 % (ref 43–77)

## 2011-01-22 LAB — CBC
HCT: 23.4 % — ABNORMAL LOW (ref 36.0–46.0)
Hemoglobin: 7.3 g/dL — ABNORMAL LOW (ref 12.0–15.0)
Hemoglobin: 9.1 g/dL — ABNORMAL LOW (ref 12.0–15.0)
Hemoglobin: 8.4 g/dL — ABNORMAL LOW (ref 12.0–15.0)
MCHC: 33.5 g/dL (ref 30.0–36.0)
MCHC: 33.8 g/dL (ref 30.0–36.0)
MCHC: 33.1 g/dL (ref 30.0–36.0)
MCV: 75 fL — ABNORMAL LOW (ref 78.0–100.0)
MCV: 75.1 fL — ABNORMAL LOW (ref 78.0–100.0)
MCV: 76.3 fL — ABNORMAL LOW (ref 78.0–100.0)
Platelets: 287 10*3/uL (ref 150–400)
Platelets: 318 10*3/uL (ref 150–400)
Platelets: 363 10*3/uL (ref 150–400)
RBC: 2.92 MIL/uL — ABNORMAL LOW (ref 3.87–5.11)
RBC: 3.58 MIL/uL — ABNORMAL LOW (ref 3.87–5.11)
RBC: 3.31 MIL/uL — ABNORMAL LOW (ref 3.87–5.11)
RDW: 20.5 % — ABNORMAL HIGH (ref 11.5–15.5)
RDW: 20.9 % — ABNORMAL HIGH (ref 11.5–15.5)
RDW: 19.4 % — ABNORMAL HIGH (ref 11.5–15.5)
RDW: 19.5 % — ABNORMAL HIGH (ref 11.5–15.5)
WBC: 10 10*3/uL (ref 4.0–10.5)
WBC: 11.8 10*3/uL — ABNORMAL HIGH (ref 4.0–10.5)
WBC: 12 10*3/uL — ABNORMAL HIGH (ref 4.0–10.5)
WBC: 13.4 10*3/uL — ABNORMAL HIGH (ref 4.0–10.5)
WBC: 11.8 10*3/uL — ABNORMAL HIGH (ref 4.0–10.5)

## 2011-01-22 LAB — PREALBUMIN
Prealbumin: 7.3 mg/dL — ABNORMAL LOW (ref 18.0–45.0)
Prealbumin: 6.6 mg/dL — ABNORMAL LOW (ref 18.0–45.0)

## 2011-01-22 LAB — MAGNESIUM: Magnesium: 1.7 mg/dL (ref 1.5–2.5)

## 2011-01-22 LAB — URINE MICROSCOPIC-ADD ON

## 2011-01-22 LAB — URINALYSIS, ROUTINE W REFLEX MICROSCOPIC
Bilirubin Urine: NEGATIVE
Glucose, UA: NEGATIVE mg/dL
Ketones, ur: NEGATIVE mg/dL
Protein, ur: NEGATIVE mg/dL

## 2011-01-22 LAB — PHOSPHORUS
Phosphorus: 3.1 mg/dL (ref 2.3–4.6)
Phosphorus: 2.9 mg/dL (ref 2.3–4.6)

## 2011-01-22 LAB — BLOOD GAS, ARTERIAL
TCO2: 24.8 mmol/L (ref 0–100)
pCO2 arterial: 33.1 mmHg — ABNORMAL LOW (ref 35.0–45.0)
pH, Arterial: 7.469 — ABNORMAL HIGH (ref 7.350–7.400)

## 2011-01-22 LAB — CROSSMATCH: ABO/RH(D): A POS

## 2011-01-22 LAB — C-REACTIVE PROTEIN
CRP: 10.4 mg/dL — ABNORMAL HIGH (ref <0.6)
CRP: 13 mg/dL — ABNORMAL HIGH (ref <0.6)
CRP: 11.9 mg/dL — ABNORMAL HIGH (ref <0.6)

## 2011-01-22 LAB — RETICULOCYTES
RBC.: 3.16 MIL/uL — ABNORMAL LOW (ref 3.87–5.11)
RBC.: 3.45 MIL/uL — ABNORMAL LOW (ref 3.87–5.11)
Retic Count, Absolute: 47.4 10*3/uL (ref 19.0–186.0)
Retic Ct Pct: 1.1 % (ref 0.4–3.1)

## 2011-01-22 LAB — HEMOGLOBIN AND HEMATOCRIT, BLOOD: Hemoglobin: 7.7 g/dL — ABNORMAL LOW (ref 12.0–15.0)

## 2011-01-22 LAB — SEDIMENTATION RATE: Sed Rate: 119 mm/hr — ABNORMAL HIGH (ref 0–22)

## 2011-01-22 LAB — VITAMIN B12: Vitamin B-12: 503 pg/mL (ref 211–911)

## 2011-01-22 LAB — IRON AND TIBC
Iron: 12 ug/dL — ABNORMAL LOW (ref 42–135)
TIBC: 113 ug/dL — ABNORMAL LOW (ref 250–470)
UIBC: 101 ug/dL

## 2011-01-22 LAB — ABO/RH: ABO/RH(D): A POS

## 2011-01-22 LAB — WOUND CULTURE

## 2011-01-25 LAB — CBC
HCT: 23.2 % — ABNORMAL LOW (ref 36.0–46.0)
Hemoglobin: 8.1 g/dL — ABNORMAL LOW (ref 12.0–15.0)
Hemoglobin: 8.1 g/dL — ABNORMAL LOW (ref 12.0–15.0)
Hemoglobin: 8.2 g/dL — ABNORMAL LOW (ref 12.0–15.0)
Hemoglobin: 9.5 g/dL — ABNORMAL LOW (ref 12.0–15.0)
MCHC: 34.5 g/dL (ref 30.0–36.0)
MCHC: 34.9 g/dL (ref 30.0–36.0)
MCHC: 35.2 g/dL (ref 30.0–36.0)
MCHC: 36.3 g/dL — ABNORMAL HIGH (ref 30.0–36.0)
MCV: 79.8 fL (ref 78.0–100.0)
Platelets: 229 10*3/uL (ref 150–400)
Platelets: 312 10*3/uL (ref 150–400)
Platelets: 316 10*3/uL (ref 150–400)
Platelets: 339 10*3/uL (ref 150–400)
Platelets: 360 10*3/uL (ref 150–400)
RBC: 2.88 MIL/uL — ABNORMAL LOW (ref 3.87–5.11)
RBC: 2.92 MIL/uL — ABNORMAL LOW (ref 3.87–5.11)
RBC: 2.92 MIL/uL — ABNORMAL LOW (ref 3.87–5.11)
RBC: 3.3 MIL/uL — ABNORMAL LOW (ref 3.87–5.11)
RDW: 17.3 % — ABNORMAL HIGH (ref 11.5–15.5)
RDW: 17.5 % — ABNORMAL HIGH (ref 11.5–15.5)
RDW: 17.7 % — ABNORMAL HIGH (ref 11.5–15.5)
RDW: 18 % — ABNORMAL HIGH (ref 11.5–15.5)
WBC: 14.8 10*3/uL — ABNORMAL HIGH (ref 4.0–10.5)
WBC: 15 10*3/uL — ABNORMAL HIGH (ref 4.0–10.5)
WBC: 15.3 10*3/uL — ABNORMAL HIGH (ref 4.0–10.5)
WBC: 17.7 10*3/uL — ABNORMAL HIGH (ref 4.0–10.5)

## 2011-01-25 LAB — URINE CULTURE
Colony Count: NO GROWTH
Colony Count: NO GROWTH
Culture: NO GROWTH
Culture: NO GROWTH
Special Requests: NEGATIVE

## 2011-01-25 LAB — COMPREHENSIVE METABOLIC PANEL
ALT: 16 U/L (ref 0–35)
Albumin: 1.7 g/dL — ABNORMAL LOW (ref 3.5–5.2)
Alkaline Phosphatase: 64 U/L (ref 39–117)
Calcium: 9 mg/dL (ref 8.4–10.5)
Potassium: 3.9 mEq/L (ref 3.5–5.1)
Sodium: 139 mEq/L (ref 135–145)
Total Protein: 6.6 g/dL (ref 6.0–8.3)

## 2011-01-25 LAB — RETICULOCYTES: RBC.: 2.92 MIL/uL — ABNORMAL LOW (ref 3.87–5.11)

## 2011-01-25 LAB — BASIC METABOLIC PANEL
BUN: 19 mg/dL (ref 6–23)
CO2: 23 mEq/L (ref 19–32)
CO2: 24 mEq/L (ref 19–32)
CO2: 25 mEq/L (ref 19–32)
CO2: 25 mEq/L (ref 19–32)
Calcium: 8.8 mg/dL (ref 8.4–10.5)
Calcium: 8.8 mg/dL (ref 8.4–10.5)
Calcium: 8.9 mg/dL (ref 8.4–10.5)
Calcium: 9 mg/dL (ref 8.4–10.5)
Calcium: 9 mg/dL (ref 8.4–10.5)
Chloride: 102 mEq/L (ref 96–112)
Chloride: 104 mEq/L (ref 96–112)
Chloride: 106 mEq/L (ref 96–112)
Chloride: 108 mEq/L (ref 96–112)
Creatinine, Ser: 1.04 mg/dL (ref 0.4–1.2)
Creatinine, Ser: 1.1 mg/dL (ref 0.4–1.2)
GFR calc Af Amer: >60 mL/min (ref >60)
GFR calc Af Amer: >60 mL/min (ref >60)
GFR calc Af Amer: >60 mL/min (ref >60)
GFR calc non Af Amer: 43 mL/min — ABNORMAL LOW (ref >60)
GFR calc non Af Amer: 47 mL/min — ABNORMAL LOW (ref >60)
GFR calc non Af Amer: 54 mL/min — ABNORMAL LOW (ref >60)
Glucose, Bld: 132 mg/dL — ABNORMAL HIGH (ref 70–99)
Glucose, Bld: 144 mg/dL — ABNORMAL HIGH (ref 70–99)
Glucose, Bld: 176 mg/dL — ABNORMAL HIGH (ref 70–99)
Glucose, Bld: 192 mg/dL — ABNORMAL HIGH (ref 70–99)
Potassium: 3.8 mEq/L (ref 3.5–5.1)
Potassium: 3.9 mEq/L (ref 3.5–5.1)
Potassium: 3.9 mEq/L (ref 3.5–5.1)
Sodium: 134 mEq/L — ABNORMAL LOW (ref 135–145)
Sodium: 136 mEq/L (ref 135–145)
Sodium: 137 mEq/L (ref 135–145)
Sodium: 137 mEq/L (ref 135–145)
Sodium: 137 mEq/L (ref 135–145)

## 2011-01-25 LAB — URINALYSIS, ROUTINE W REFLEX MICROSCOPIC
Glucose, UA: NEGATIVE mg/dL
Glucose, UA: NEGATIVE mg/dL
Hgb urine dipstick: NEGATIVE
Ketones, ur: NEGATIVE mg/dL
Protein, ur: 30 mg/dL — AB
Specific Gravity, Urine: 1.01 (ref 1.005–1.030)
Urobilinogen, UA: 1 mg/dL (ref 0.0–1.0)

## 2011-01-25 LAB — DIFFERENTIAL
Basophils Absolute: 0 10*3/uL (ref 0.0–0.1)
Basophils Absolute: 0.1 10*3/uL (ref 0.0–0.1)
Basophils Absolute: 0.1 10*3/uL (ref 0.0–0.1)
Basophils Relative: 1 % (ref 0–1)
Basophils Relative: 1 % (ref 0–1)
Basophils Relative: 1 % (ref 0–1)
Basophils Relative: 1 % (ref 0–1)
Basophils Relative: 1 % (ref 0–1)
Eosinophils Absolute: 0.3 10*3/uL (ref 0.0–0.7)
Eosinophils Absolute: 0.3 10*3/uL (ref 0.0–0.7)
Eosinophils Absolute: 0.5 10*3/uL (ref 0.0–0.7)
Eosinophils Relative: 4 % (ref 0–5)
Lymphocytes Relative: 17 % (ref 12–46)
Lymphocytes Relative: 17 % (ref 12–46)
Lymphocytes Relative: 18 % (ref 12–46)
Lymphs Abs: 2.2 10*3/uL (ref 0.7–4.0)
Lymphs Abs: 2.5 10*3/uL (ref 0.7–4.0)
Lymphs Abs: 2.5 10*3/uL (ref 0.7–4.0)
Lymphs Abs: 2.8 10*3/uL (ref 0.7–4.0)
Lymphs Abs: 2.9 10*3/uL (ref 0.7–4.0)
Lymphs Abs: 2.9 10*3/uL (ref 0.7–4.0)
Monocytes Absolute: 0.8 10*3/uL (ref 0.1–1.0)
Monocytes Absolute: 1 10*3/uL (ref 0.1–1.0)
Monocytes Absolute: 1.1 10*3/uL — ABNORMAL HIGH (ref 0.1–1.0)
Monocytes Absolute: 1.1 10*3/uL — ABNORMAL HIGH (ref 0.1–1.0)
Monocytes Absolute: 1.2 10*3/uL — ABNORMAL HIGH (ref 0.1–1.0)
Monocytes Relative: 5 % (ref 3–12)
Monocytes Relative: 6 % (ref 3–12)
Monocytes Relative: 6 % (ref 3–12)
Monocytes Relative: 7 % (ref 3–12)
Monocytes Relative: 7 % (ref 3–12)
Neutro Abs: 10.7 10*3/uL — ABNORMAL HIGH (ref 1.7–7.7)
Neutro Abs: 11 10*3/uL — ABNORMAL HIGH (ref 1.7–7.7)
Neutro Abs: 11 10*3/uL — ABNORMAL HIGH (ref 1.7–7.7)
Neutro Abs: 11 10*3/uL — ABNORMAL HIGH (ref 1.7–7.7)
Neutro Abs: 11.7 10*3/uL — ABNORMAL HIGH (ref 1.7–7.7)
Neutro Abs: 11.9 10*3/uL — ABNORMAL HIGH (ref 1.7–7.7)
Neutro Abs: 13.8 10*3/uL — ABNORMAL HIGH (ref 1.7–7.7)
Neutro Abs: 9.8 10*3/uL — ABNORMAL HIGH (ref 1.7–7.7)
Neutrophils Relative %: 73 % (ref 43–77)
Neutrophils Relative %: 73 % (ref 43–77)
Neutrophils Relative %: 75 % (ref 43–77)
Neutrophils Relative %: 78 % — ABNORMAL HIGH (ref 43–77)
Neutrophils Relative %: 78 % — ABNORMAL HIGH (ref 43–77)

## 2011-01-25 LAB — GLUCOSE, CAPILLARY
Glucose-Capillary: 102 mg/dL — ABNORMAL HIGH (ref 70–99)
Glucose-Capillary: 114 mg/dL — ABNORMAL HIGH (ref 70–99)
Glucose-Capillary: 118 mg/dL — ABNORMAL HIGH (ref 70–99)
Glucose-Capillary: 127 mg/dL — ABNORMAL HIGH (ref 70–99)
Glucose-Capillary: 133 mg/dL — ABNORMAL HIGH (ref 70–99)
Glucose-Capillary: 137 mg/dL — ABNORMAL HIGH (ref 70–99)
Glucose-Capillary: 137 mg/dL — ABNORMAL HIGH (ref 70–99)
Glucose-Capillary: 140 mg/dL — ABNORMAL HIGH (ref 70–99)
Glucose-Capillary: 141 mg/dL — ABNORMAL HIGH (ref 70–99)
Glucose-Capillary: 142 mg/dL — ABNORMAL HIGH (ref 70–99)
Glucose-Capillary: 145 mg/dL — ABNORMAL HIGH (ref 70–99)
Glucose-Capillary: 148 mg/dL — ABNORMAL HIGH (ref 70–99)
Glucose-Capillary: 151 mg/dL — ABNORMAL HIGH (ref 70–99)
Glucose-Capillary: 152 mg/dL — ABNORMAL HIGH (ref 70–99)
Glucose-Capillary: 158 mg/dL — ABNORMAL HIGH (ref 70–99)
Glucose-Capillary: 164 mg/dL — ABNORMAL HIGH (ref 70–99)
Glucose-Capillary: 166 mg/dL — ABNORMAL HIGH (ref 70–99)
Glucose-Capillary: 170 mg/dL — ABNORMAL HIGH (ref 70–99)
Glucose-Capillary: 173 mg/dL — ABNORMAL HIGH (ref 70–99)
Glucose-Capillary: 178 mg/dL — ABNORMAL HIGH (ref 70–99)
Glucose-Capillary: 187 mg/dL — ABNORMAL HIGH (ref 70–99)
Glucose-Capillary: 74 mg/dL (ref 70–99)
Glucose-Capillary: 82 mg/dL (ref 70–99)
Glucose-Capillary: 83 mg/dL (ref 70–99)
Glucose-Capillary: 98 mg/dL (ref 70–99)
Glucose-Capillary: 98 mg/dL (ref 70–99)

## 2011-01-25 LAB — CK TOTAL AND CKMB (NOT AT ARMC)
Relative Index: INVALID (ref 0.0–2.5)
Total CK: 16 U/L (ref 7–177)

## 2011-01-25 LAB — CARDIAC PANEL(CRET KIN+CKTOT+MB+TROPI)
CK, MB: 0.6 ng/mL (ref 0.3–4.0)
Relative Index: INVALID (ref 0.0–2.5)
Total CK: 12 U/L (ref 7–177)
Total CK: 13 U/L (ref 7–177)
Total CK: 17 U/L (ref 7–177)
Troponin I: 0.01 ng/mL (ref 0.00–0.06)
Troponin I: 0.01 ng/mL (ref 0.00–0.06)

## 2011-01-25 LAB — CULTURE, BLOOD (ROUTINE X 2)
Culture: NO GROWTH
Culture: NO GROWTH
Report Status: 3142011
Report Status: 3192011

## 2011-01-25 LAB — URINE MICROSCOPIC-ADD ON

## 2011-01-25 LAB — VITAMIN B12: Vitamin B-12: 1083 pg/mL — ABNORMAL HIGH (ref 211–911)

## 2011-01-25 LAB — VANCOMYCIN, TROUGH
Vancomycin Tr: 18.4 ug/mL (ref 10.0–20.0)
Vancomycin Tr: 28.7 ug/mL (ref 10.0–20.0)

## 2011-01-25 LAB — HEMOGLOBIN A1C
Hgb A1c MFr Bld: 7.2 % — ABNORMAL HIGH (ref 4.6–6.1)
Mean Plasma Glucose: 160 mg/dL

## 2011-01-25 LAB — IRON AND TIBC
Iron: 22 ug/dL — ABNORMAL LOW (ref 42–135)
Saturation Ratios: 17 % — ABNORMAL LOW (ref 20–55)
TIBC: 129 ug/dL — ABNORMAL LOW (ref 250–470)
UIBC: 107 ug/dL

## 2011-01-25 LAB — FERRITIN: Ferritin: 1106 ng/mL — ABNORMAL HIGH (ref 10–291)

## 2011-01-25 LAB — HEMOGLOBIN AND HEMATOCRIT, BLOOD: Hemoglobin: 9.1 g/dL — ABNORMAL LOW (ref 12.0–15.0)

## 2011-01-25 LAB — BRAIN NATRIURETIC PEPTIDE: Pro B Natriuretic peptide (BNP): 258 pg/mL — ABNORMAL HIGH (ref 0.0–100.0)

## 2011-01-25 LAB — CROSSMATCH
ABO/RH(D): A POS
Antibody Screen: NEGATIVE

## 2011-01-25 LAB — MAGNESIUM: Magnesium: 1.6 mg/dL (ref 1.5–2.5)

## 2011-01-25 LAB — TSH: TSH: 2.534 u[IU]/mL (ref 0.350–4.500)

## 2011-03-19 NOTE — Assessment & Plan Note (Signed)
[facility]                       [place] CARDIOLOGY OFFICE NOTE   NAME:Desiree Joyce, Desiree Joyce                     MRN:          [id number]  DATE:08/27/2007                            DOB:          [birth year]    IDENTIFICATION:  Desiree Joyce is a 77 year old woman who was referred by  Dr. Jorene Guest for evaluation of chest pain.   HISTORY OF PRESENT ILLNESS:  The patient is a poor historian.  She said  she had open heart surgery in the 40s, I do not have any details on  this.   She comes in for referral for evaluation of chest pain.  I have few  records.   On talking to the patient today, she says she has left-sided chest pain  approximately 2 to 3 times per day, occurred over the past year, occurs  with and without activity.  She uses a walker and notes no increased  symptoms with this.   She also notes some increased shortness of breath and wheezing, she also  complains of stomach pains.   ALLERGIES:  None.   MEDICATIONS:  1. Hemacyte 324.  2. Imdur 60.  3. Hydralazine 10 b.i.d.  4. Coreg 12.5 b.i.d.  5. K-Dur 20 mEq.  6. Aricept 10.  7. Lexapro 10.  8. Simvastatin 20.  9. Xanax 0.5 b.i.d.  10.Dicyclomine 10 q.i.d.  11.Zyrtec 10.  12.Mobic 15.  13.Loratadine 10.  14.Calcium with D b.i.d.  15.Omeprazole 20 b.i.d.  16.Lisinopril/hydrochlorothiazide 20/12.5 daily.   PAST MEDICAL HISTORY:  1. Hypertension.  2. Dementia.  3. Dyslipidemia.  4. DJD, question anemia.  5. Pneumonia in February, 2008.  6. History of UTI.  7. History of hypokalemia.  8. History of gait disorder.   SOCIAL HISTORY:  1. The patient is retired.  2. She lives in a care center.  3. Does not smoke.  4. Does not drink.  5. Has 2 daughters.  6. She lives at the [facility].   FAMILY HISTORY:  1. Both parents died of MIs.  2. One sibling died of cancer.   REVIEW OF SYSTEMS:  All systems reviewed.  Negative to the above problem  except as  noted.   PHYSICAL EXAM:  On exam, the patient is in no distress.  Blood pressure  is 120/80, pulse is 74, weight 194.  HEENT:  Normocephalic, atraumatic, EOMI, PERRL.  Mucous membranes moist,  throat clear.  NECK:  No audible bruits, JVP is normal, no masses.  LUNGS:  Some decreased airflow.  Note, she has mild wheeze that seems to  clear with cough, there is some rhonchi.  CARDIAC EXAM:  Regular rate and rhythm, S1 S2, no S3, no significant  murmurs.  ABDOMEN:  Benign, obese.  EXTREMITIES:  Trace edema.   A 12-lead electrocardiogram shows sinus rhythm 77 beats per minute,  nonspecific ST changes.   IMPRESSION:  Chest pain.  Patient is a very poor historian.  I have sent  her off for a chest x-ray that showed cardiomegaly, chronic obstructive  pulmonary disease, chronic lung changes but no acute process.   Based on the above,  the fact that I really cannot tell what her symptoms  are, I would schedule a Myoview if she were interested in proceeding  with further evaluation.  Again, she is not ready to make this decision  and I will call the patient's daughters to discuss this with them.   For now, I would continue on the current regimen as she is for her other  medical problems and I will be back in touch with her and her family  once I have talked to them.     Pricilla Riffle, MD, [facility]  Electronically Signed   PVR/MedQ  DD: 08/27/2007  DT: 08/28/2007  Job #: [id number]

## 2011-03-22 NOTE — Consult Note (Signed)
Desiree Joyce, Desiree Joyce              ACCOUNT NO.:  [id number]   MEDICAL RECORD NO.:  [id number]          PATIENT TYPE:  INP   LOCATION:  A215                          FACILITY:  [facility]   PHYSICIAN:  Kofi A. Gerilyn Pilgrim, M.D. DATE OF BIRTH:  10/12/1921   DATE OF CONSULTATION:  12/22/2006  DATE OF DISCHARGE:                                 CONSULTATION   REASON FOR CONSULTATION:  Generalized weakness.   HISTORY:  This is an 77 year old black female who presents with  progressive unresponsiveness, global weakness and shortness of breath;  along with not eating well.  She was diagnosed as having pneumonia on  admission, with bibasilar infiltrates. She was given a Z-pak and sent  back home, but got progressively weaker with lethargy and progressive  anorexia.  The daughter also noted that the patient had weakness on the  right side, although that has improved since the admission to the  hospital.  No headaches are reported.  No fevers.  No abdominal  symptoms.   PAST MEDICAL HISTORY:  1. Advanced dementia of the Alzheimer's type.  2. No history of supravasculare infarcts, per the daughter; although      that is reported in the chart.  3. Hyperlipidemia.  4. Hypertension.  5. Congestive heart failure.  6. Anxiety disorder.   FAMILY HISTORY:  Significant for hypertension, coronary artery disease.   ALLERGIES:  NONE KNOWN.   ADMISSION MEDICATIONS:  1. Aspirin 325 mg daily.  2. Lasix.  3. Potassium.  4. Imdur.  5. Allegra.  6. Flonase.  7. Lexapro.  8. Hemasite.   SOCIAL HISTORY:  As per the family no history of alcohol or drug use.   REVIEW OF SYSTEMS:  As stated in the history of present illness.  All  other systems reviewed and negative.   PHYSICAL EXAMINATION:  GENERAL:  A pleasant lady in no acute distress.  VITAL SIGNS:  Temperature 97.1, pulse 103, respirations 20, blood  pressure 147/87.  HEENT:  Head is normocephalic and atraumatic.  NECK:  Supple.  ABDOMEN:  Soft.  EXTREMITIES:  No significant edema or varicosities.  There may be some  right elbow pain noted, but no lesions or fractures are noted there.  There is no swelling.  MENTATION:  The patient is lying in bed with eyes closed.  She does open  her eyes spontaneously.  Does follow commands well, both axially and  appendicularly.  She is oriented to times one essentially, she thinks  she is Desiree Joyce.  She had difficulty with word finding, especially with  stating that she is in the hospital; although other direct naming seems  fine.  Comprehension seems okay. There may be some reduction in fluency.  No dysarthria is noted.  NEUROLOGIC:  No visual field defects are seen.  Pupils are 4 mm and  reactive to light.  Extraocular movements intact.  Facial muscles are  symmetric tone and tongue is midline.  The uvula is also midline.  Shoulder shrugs are normal.  Motor examination shows mild right  hemiparesis, with the right upper extremity being 2/5 and the left leg  3/5.  She moves her left side normally, although she has some left leg  weakness (probably 4/5).  Reflexes are symmetric bilaterally, with  plantar flexes downgoing.  Sensation to temperature and light touch  bilaterally.  Coordination shows no dysmetria or tremors.   DIAGNOSTIC DATA:  Head CT scan shows some marked atrophy.  There is also  paraventricular leukoencephalopathy, especially around the posterior  horns.  Nothing acute is seen.   Arterial blood gas:  FIO2 28%, pH 7.4, pCO2 39, pO2 71.  WBC 5.8,  hemoglobin 12, platelet count 177.  Sodium 149, potassium 3.6, chloride  113, CO2 27, glucose 127, BUN 20, creatinine 0.8.  CPK 2890 (on  admission balance 1952), MB fraction 4.2 and 7.8.  Urinalysis:  Epithelial cells few, nitrites negative, wbc's 0-2, rbc 7-10.   ASSESSMENT:  1. ACUTE ENCEPHALOPATHY.  Multifactorial due to acute stroke,      dehydration, hyponatremia and pneumonia.  2. ACUTE LEFT HEMISPHERIC INFARCT.    RECOMMENDATIONS:  1. I agree with carotid and echocardiogram (that has been ordered).  2. She probably has extensive spinal MRI's, which I think probably is      not needed.  I am going to go ahead and change that to a brain MRI      scan.  3. I am upgrade her antiplatelet agents to Aggrenox.      Kofi A. Gerilyn Pilgrim, M.D.  Electronically Signed     KAD/MEDQ  D:  12/22/2006  T:  12/22/2006  Job:  [id number]

## 2011-03-22 NOTE — Discharge Summary (Signed)
Desiree Joyce, ROETS              ACCOUNT NO.:  [id number]   MEDICAL RECORD NO.:  [id number]          PATIENT TYPE:  INP   LOCATION:  A329                          FACILITY:  [facility]   PHYSICIAN:  Skeet Latch, DO    DATE OF BIRTH:  1921/08/13   DATE OF ADMISSION:  12/21/2006  DATE OF DISCHARGE:  02/28/2008LH                               DISCHARGE SUMMARY   DISCHARGE DIAGNOSES:  1. Gait disorder.  2. Urinary tract infection.  3. Pneumonia.  4. Hypokalemia.  5. Dementia.   HISTORY OF PRESENT ILLNESS:  Desiree Joyce is an 77 year old  African/American female who presented from an adult home.  It appeared  that the patient approximately one week ago before admission had an  altered mental status.  The patient was not eating well and was short of  breath.  The patient cannot give an accurate history because she has  advanced dementia.  Apparently the patient was brought to the emergency  room and found to have pneumonia on chest x-ray.  She was given  Zithromax and sent back to the adult home.  After the patient returned  to the nursing home she got progressively weaker and still continued not  to eat.  The patient did not have any fevers.  Had some occasional cough  and had some slight abdominal pain.  The patient's family decided to  bring the patient for evaluation at the hospital.   HOSPITAL COURSE:  The patient was seen and placed on her home  medications.  The patient had a CT of her head performed which showed no  acute findings.  It showed acute on chronic sinusitis.  It also showed  atrophy and nonspecific white matter changes.  It showed right  mastoiditis and sinus disease.  Neurology was consulted.  Her dementia  medications were adjusted.  The patient also had an ultrasound of her  bilateral carotids.  It showed some right carotid bifurcation plaque  without significant stenosis.  The patient had an MRI of her brain done  the next day showing some possible small acute  infarction in the white  matter, lateral to the fourth ventricle on the right.  The patient also  had an MRI of her cervical spine showing no cord compression and showed  cervical disk disease and spondylosis.  It showed osteophyte formation  on the left at C3-C4 with left foraminal encroachment.  Subsequently an  x-ray was done on December 31, 2006, showing cardiomegaly with  bronchitic changes.  It showed mild infiltrate in the right lower lobe  with improvement in volume since the previous study.   During her hospital stay the patient was complaining of some abdominal  pain.  An acute abdominal series was performed showing some fecal  impaction in the rectum and showed right lower lobe infiltrate.  At this  time the patient is stable and afebrile and ready for discharge.   DISCHARGE MEDICATIONS:  1. Lexapro 10 mg daily.  2. Hemacyte 324 mg daily.  3. Aspirin 325 mg daily.  4. __________ plus one tab daily.  5. Imdur 60 mg daily.  6. Allegra 180 mg daily.  7. Flonase two sprays in each nostril daily.  8. Famotidine 20 mg b.i.d.  9. Hydralazine 10 mg b.i.d.  10.Coreg 12.5 mg p.o. b.i.d.  11.Xanax 0.5 mg p.o. t.i.d.  12.Eclanamine 20 mg q.a.c. and q.h.s.  13.Lipitor 10 mg q.h.s.  14.Haldol 1 mg p.o. q.h.s. p.r.n.  15.Vicodin 5/500 mg, one tab p.o. q.6h. p.r.n.  16.Tylenol 325 mg p.o. q.6h. p.r.n.  17.K-Dur 20 mEq daily.  18.Aricept 5 mg p.o. q.h.s.  19.Levaquin 750 mg p.o. x7 days.  20.Hydrochlorothiazide 12.5 mg p.o. daily.   DIET:  The patient is to maintain a low-sodium, heart-healthy diet.   ACTIVITY:  I believe the patient can walk with assistance or be in a  wheelchair.   DISPOSITION:  The patient is being discharged to the [facility] in  [place].      Skeet Latch, DO  Electronically Signed     SM/MEDQ  D:  01/01/2007  T:  01/01/2007  Job:  [id number]

## 2011-03-22 NOTE — H&P (Signed)
NAME:  Desiree Joyce, Desiree Joyce                        ACCOUNT NO.:  [id number]   MEDICAL RECORD NO.:  [id number]                   PATIENT TYPE:  INP   LOCATION:  A215                                 FACILITY:  [facility]   PHYSICIAN:  Dirk Dress. Katrinka Blazing, M.D.                DATE OF BIRTH:  1921-04-28   DATE OF ADMISSION:  09/05/2003  DATE OF DISCHARGE:                                HISTORY & PHYSICAL   HISTORY OF PRESENT ILLNESS:  An 77 year old female admitted for evaluation  of chest pain and neck pain.  The patient is a very poor historian.  She  came to the office complaining of pain in her upper mid chest with  increasing fatigue and weakness.  She has a history of atherosclerotic heart  disease.  General physical exam revealed no acute abnormalities, but it was  felt that she needed to be admitted to rule out myocardial ischemia.   PAST MEDICAL HISTORY:  1. Atherosclerotic heart disease.  2. Hypertension.  3. Peptic ulcer disease.  4. Osteoarthritis.  5. Chronic anxiety disorder.  6. Hyperlipidemia.  7. Depression.  8. Mild dementia.  9. History of stage 2A Hodgkin's lymphoma treated with radiation therapy     with no evidence of recurrence over many years.  10.      The patient had a stress Cardiolite study on June 09, 2002, that     reveal normal wall motion, no wall abnormalities.   PAST SURGICAL HISTORY:  1. Right salpingo-oophorectomy.  2. Appendectomy.  3. Hemorrhoidectomy.  4. Total abdominal hysterectomy.  5. Cholecystectomy.   ALLERGIES:  1. SULFA.  2. BEE STINGS.   MEDICATIONS:  1. Pepcid 20 mg b.i.d.  2. Hydralazine 10 mg t.i.d.  3. Aspirin 325 mg q.d.  4. Chlorocon 10 mEq q.d.  5. Lipitor 10 mg q.d.  6. Hemocyte one q.d.  7. Xanax 1 mg t.i.d.  8. ISMO 60 mg q.d.  9. Altace 10 mg b.i.d.  10.      Coreg 6.25 mg b.i.d.  11.      Lexapro 10 mg q.d.  12.      Nitrostat 0.4 mg sublingual p.r.n. chest pain.   SOCIAL HISTORY:  She is a widow and lives alone.   She has a primary care  worker who assists her daily.   PHYSICAL EXAMINATION:  GENERAL:  The patient looks clinically depressed.  VITAL SIGNS:  Blood pressure 132/68, pulse 80, respirations 20, weight 173  pounds.  HEENT:  Unremarkable.  There are no acute changes.  NECK:  Supple, I cannot detect any tenderness.  No adenopathy or  thyromegaly.  CHEST:  Clear to auscultation.  There is no palpable chest wall tenderness.  HEART:  Regular rate and rhythm without murmurs, rubs, or gallops.  ABDOMEN:  Mild tenderness in the epigastrium.  Well-healed surgical scars.  EXTREMITIES:  No cyanosis, clubbing, or edema.  NEUROLOGIC:  No  focal motor, sensory, or cerebellar deficit.   IMPRESSION:  1. Atypical chest pain in a patient with suspected atherosclerotic heart     disease, but with a reportedly normal stress Cardiolite in August 2003.  2. Probable primary depression contributing to #1.  3. Hypertension.  4. Peptic ulcer disease.  5. Osteoarthritis.  6. Chronic anxiety disorder.  7. Mild dementia.  8. Hyperlipidemia.  9. History of stage 2A Hodgkin's lymphoma, in remission.   PLAN:  The patient will be admitted.  She will have routine followup.  She  will be continued on her baseline medications.  Cardiac enzymes will be  ordered.  Serial EKGs will be done.  She probably should have a nuclear  stress test done during this hospitalization.     ___________________________________________                                         Dirk Dress Katrinka Blazing, M.D.   LCS/MEDQ  D:  09/06/2003  T:  09/06/2003  Job:  [id number]

## 2011-03-22 NOTE — Procedures (Signed)
Desiree Joyce, Desiree Joyce              ACCOUNT NO.:  [id number]   MEDICAL RECORD NO.:  [id number]          PATIENT TYPE:  INP   LOCATION:  A215                          FACILITY:  [facility]   PHYSICIAN:  Gerrit Friends. Dietrich Pates, MD, FACCDATE OF BIRTH:  [birth year]   DATE OF PROCEDURE:  12/23/2006  DATE OF DISCHARGE:                                ECHOCARDIOGRAM   ECHOCARDIOGRAM.   REFERRING:  [facility].   CLINICAL DATA:  An 77 year old woman with dyspnea, weakness and  hypertension; pneumonia versus congestive heart failure.   M-MODE:  Aorta 2.6, left atrium 5.0, septum 1.1, posterior wall 1.4, LV  diastole 4.7, LV systole 3.7.   1. Technically adequate echocardiographic study.  2. Mild to moderate left atrial enlargement; normal right atrial size.  3. Right ventricular size at the upper limit of normal; normal wall      thickness; normal RV function.  4. Moderate sclerosis of the aortic leaflets; no stenosis by Doppler      examination.  5. Normal internal dimension of the proximal ascending aorta; mild      calcification of the wall.  6. Normal mitral valve; mild to moderate annular calcification; mild      regurgitation.  7. Normal tricuspid valve with physiologic regurgitation and normal      estimated RV systolic pressure.  8. Pulmonic valve and proximal pulmonary artery not adequately imaged.  9. Left ventricular size at the upper limit of normal; mild      hypertrophy; severe hypokinesis of the basilar inferior and      posterior segments.  Overall LV systolic function is low normal.      Estimated ejection fraction is 0.50-0.55.  10.IVC diameter at the upper limit of normal; decreases normally with      inspiration.      Gerrit Friends. Dietrich Pates, MD, [facility]  Electronically Signed     RMR/MEDQ  D:  12/23/2006  T:  12/24/2006  Job:  [id number]

## 2011-03-22 NOTE — H&P (Signed)
NAME:  Desiree Joyce, Desiree Joyce                        ACCOUNT NO.:  [id number]   MEDICAL RECORD NO.:  [id number]                   PATIENT TYPE:  AMB   LOCATION:  DAY                                  FACILITY:  [facility]   PHYSICIAN:  Jerolyn Shin C. Katrinka Blazing, M.D.                DATE OF BIRTH:  [birth year]   DATE OF ADMISSION:  DATE OF DISCHARGE:                                HISTORY & PHYSICAL   An 77 year old female with recurrent abdominal pain, recurrent nausea with  vomiting with midepigastric discomfort.  The patient has had prior  colonoscopy and CT.  She is undergoing upper endoscopy for completion of  evaluation.   PAST HISTORY:  1. Hypertension.  2. Chronic anxiety.  3. Hyperlipidemia.  4. Coronary artery disease.  5. Osteoarthritis.  6. Stage 2A lymphoma, Hodgkin type in remission.   MEDICATIONS:  1. Lipitor 10 mg q.h.s.  2. Pepcid 20 mg every day.  3. Potassium 10 mEq every day.  4. Xanax 0.5 mg t.i.d.  5. Lasix 20 mg every day.  6. Aspirin 325 mg every day.  7. Isosorbide dinitrate 60 mg every day.  8. Coreg 6.25 mg b.i.d.  9. Paxil CR 12.5 mg every day.  10.      Vicodin 5/500 b.i.d. p.r.n.  11.      Hemocyte one every day.  12.      Hydralazine 10 mg b.i.d.  13.      Prenatal vitamins one every day.   PHYSICAL EXAMINATION:  VITAL SIGNS:  Blood pressure 140/78, pulse 80,  respirations 20, weight 184 pounds.  HEENT:  No acute abnormality.  NECK:  Supple.  No JVD, bruit, adenopathy, or thyromegaly.  CHEST:  Clear to auscultation.  HEART:  Regular rate and rhythm without murmur, gallop or rub.  ABDOMEN:  Moderate epigastric and right upper quadrant tenderness.  Mild  hypogastric tenderness.  EXTREMITIES:  Mild tenderness left lateral calf.  NEUROLOGIC:  No focal deficit.   IMPRESSION:  1. Recurrent abdominal pain with nausea, vomiting.  2. Osteoarthritis.  3. Hypertension.  4. Coronary artery disease.  5. Chronic arthritis.  6. Stage 2A Hodgkin lymphoma in remission.  7.  Chronic anxiety disorder.   PLAN:  Upper endoscopy.     ___________________________________________                                         Dirk Dress. Katrinka Blazing, M.D.   LCS/MEDQ  D:  06/13/2004  T:  06/13/2004  Job:  [id number]

## 2011-03-22 NOTE — Op Note (Signed)
[facility]  Patient:    BRYLEY, CHRISMAN Visit Number: [id number] MRN: [id number]          Service Type: DSU Location: DAY Attending Physician:  Dessa Phi Dictated by:   Elpidio Anis, M.D. Proc. Date: 03/19/02 Admit Date:  03/19/2002 Discharge Date: 03/19/2002   CC:         Ladona Horns. Mariel Sleet, M.D.   Operative Report  PREOPERATIVE DIAGNOSES:  Mass of the left buttock, prior history of lymphoma.  POSTOPERATIVE DIAGNOSES:  Mass of the left buttock, prior history of lymphoma.  PROCEDURE:  Wide excision of mass of the left buttock 7 cm x 5 cm x 3 cm.  SURGEON:  Elpidio Anis, M.D.  DESCRIPTION OF PROCEDURE:  Under spinal anesthesia, the buttocks were prepped and draped in a sterile field. The patient was in the prone position. An elliptical incision was made over the mass on the left buttock. The incision was extended to the subcutaneous fat. The mass itself was very hard and difficult to grasp. It appeared to be calcified. A wide excision around the mass was carried out. The mass was sent to radiography for x-ray. Tissues were closed with 2-0 Biosyn, 3-0 Biosyn and 4-0 Dexon. A sterile dressing was placed. She was transferred to a bed and taken to the post anesthesia care unit. Dictated by:   Elpidio Anis, M.D. Attending Physician:  Dessa Phi DD:  03/19/02 TD:  03/22/02 Job: [id number]

## 2011-03-22 NOTE — H&P (Signed)
NAME:  Desiree Joyce, Desiree Joyce                        ACCOUNT NO.:  [id number]   MEDICAL RECORD NO.:  [id number]                   PATIENT TYPE:  AMB   LOCATION:  DAY                                  FACILITY:  [facility]   PHYSICIAN:  Jerolyn Shin C. Katrinka Blazing, M.D.                DATE OF BIRTH:  08/22/1921   DATE OF ADMISSION:  DATE OF DISCHARGE:                                HISTORY & PHYSICAL   HISTORY OF PRESENT ILLNESS:  [age]-year-old female with history of  progressive constipation, chronic abdominal pain, recurrent nausea, with  chronic laxative abuse, who is referred for colonoscopy.  She has not had  rectal bleeding.   PAST HISTORY:  Past history positive for:  1. Hypertension.  2. Coronary artery disease.  3. Chronic anxiety.  4. Osteoarthritis.  5. Hyperlipidemia.  6. Stage IIA Hodgkin's lymphoma in remission.   FAMILY HISTORY:  Family history is positive for colon cancer.   MEDICATIONS:  1. Vicodin 5/500 mg daily.  2. Paxil CR 12.5 mg daily.  3. Coreg 6.25 mg b.i.d.  4. Isosorbide dinitrate 60 mg daily.  5. Lasix 20 mg daily.  6. Aspirin 325 mg daily.  7. Xanax 0.5 mg t.i.d.  8. Potassium 10 mEq daily.  9. Pepcid 20 mg daily.  10.      Lipitor 10 mg h.s.   ALLERGIES:  SULFA.   SURGERY:  1. Right salpingo-oophorectomy.  2. Appendectomy.  3. Hemorrhoidectomy.  4. Total abdominal hysterectomy.  5. Cholecystectomy.   PHYSICAL EXAMINATION:  VITAL SIGNS:  On exam, blood pressure 160/90, pulse  80, respirations 20, weight 184 pounds.  HEENT:  Unremarkable.  NECK:  Neck is supple without JVD or bruit.  CHEST:  Chest clear to auscultation.  HEART:  Heart regular rate and rhythm without murmur, gallop or rub.  ABDOMEN:  Moderate right lower quadrant tenderness.  Well-healed surgical  scars and hypogastric scar.  RECTAL:  No masses.  Stool guaiac negative.  EXTREMITIES:  No cyanosis, clubbing or edema.  NEUROLOGIC EXAM:  No focal motor, sensory or cerebellar deficit.   IMPRESSION:  1. Recurrent abdominal pain with progressive constipation.  2. Family history of colon cancer.  3. Hypertension.  4. Coronary artery disease.  5. Peptic ulcer disease.  6. Depression with anxiety.  7. History of stage IIA Hodgkin's lymphoma in remission.   PLAN:  Colonoscopy.     ___________________________________________                                         Dirk Dress. Katrinka Blazing, M.D.   LCS/MEDQ  D:  04/18/2004  T:  04/19/2004  Job:  [id number]

## 2011-03-22 NOTE — H&P (Signed)
Desiree Joyce, Desiree Joyce              ACCOUNT NO.:  [id number]   MEDICAL RECORD NO.:  [id number]          PATIENT TYPE:  INP   LOCATION:  A215                          FACILITY:  [facility]   PHYSICIAN:  Desiree Joyce, M.D.DATE OF BIRTH:  October 01, 1921   DATE OF ADMISSION:  12/21/2006  DATE OF DISCHARGE:  LH                              HISTORY & PHYSICAL   ADDENDUM:  CNS:  Patient was lethargic but conscious, was not following  commands and did not have an significant right-sided movement, deep  sternal rub.  HEENT:  His pupils were sluggish bilaterally.   LABORATORY/DIAGNOSTIC DATA:  Chest x-ray shows bibasilar airspace  disease.   Blood gas showed a pH of 7.4 to PCO2 of 39, PO2 of 71.  Bicarbonate was  24.6, WBC was 5.8, hemoglobin of 12.5, hematocrit 38.2, platelet count  was 177, no left shift.  Calcium 9.6, urinalysis was negative.  Urine  microscopic was pending.   A 12-lead EKG showed a normal sinus rhythm with some premature  ventricular contractions.   ASSESSMENT/PLAN:  Ms. Reise is an 77 year old African-American female  brought in with altered mental status and a history of cough which  generalized weakness over the past week.  It appears the patient has a  pneumonia at this time.   Will start her on Levaquin 750 mg IV once a day and also place her on  oxygen therapy.  I will give her nebs treatment as needed.  More  concerning though is the physical exam of a right hemi-neglect.  This  may represent an acute cerebrovascular accident.  I will get a CT scan  of her head.  Initially, when I was called by the emergency room, I was  not aware of the right hemiparesis, also.  I examined her on 3A , so I  will be transferring her to 2A after a CT scan.  I will put her on  telemetry for further evaluation.  I have discussed with a power of  attorney for health here, her daughters, and they want her to remain  full code.  We will keep her NPO at this time, as she is a  significant  aspiration risk.   Will discuss further care in terms of additional testing as needed,  depending on what the CT scans shows.  The patient may get an MRI and  echo and carotid Doppler down the line.  Family members insist that  patient was able to get around with a walker at [facility] adult  home.      Desiree Joyce, M.D.  Electronically Signed     AM/MEDQ  D:  12/21/2006  T:  12/21/2006  Job:  [id number]

## 2011-03-22 NOTE — Consult Note (Signed)
Desiree Joyce, Desiree Joyce              ACCOUNT NO.:  [id number]   MEDICAL RECORD NO.:  [id number]          PATIENT TYPE:  INP   LOCATION:  A329                          FACILITY:  [facility]   PHYSICIAN:  Kofi A. Gerilyn Pilgrim, M.D. DATE OF BIRTH:  1921/06/10   DATE OF CONSULTATION:  12/30/2006  DATE OF DISCHARGE:                                 CONSULTATION   REASON FOR CONSULTATION:  Reconsulted on this patient for generalized  weakness.   HISTORY OF PRESENT ILLNESS:  The patient was an 77 year old black female  who was previously seen for global weakness.  There may have been some  residual problems yesterday and hence this consultation.  Her workup was  relatively unrevealing.  She did have a right pontine lesion although on  initial examination she appeared to have had some right-sided weakness.  Her cervical spine MRI was done and is reviewed today.  She does have  some central herniated disks at a couple of levels but there is no  spinal cord compression.   PAST MEDICAL HISTORY:  Again Alzheimer's dementia, congestive heart  failure, anxiety disorder, dyslipidemia.   PHYSICAL EXAMINATION:  She is awake and alert.  She is somewhat  resistant to examination.  She is disoriented, however.  She does follow  commands but only intermittently, at other times she resists the  neurological examination.  Pupils are equal, round, react to light and  accommodation.  Extraocular movements are intact.  Facial muscle  strength is symmetric.  She has antigravity strength in the upper  extremities and also in lower extremities and at least 4-5 strength of  upper and lower extremities.  Her reflexes are preserved.   ASSESSMENT:  1. Generalized weakness that is mild.  I see no attributable      neurological cause at this time.  2. Advanced dementia.   PLAN:  I think we should continue with aggressive physical therapy.  She  is also not on Aricept.  I think she could benefit from this.      Kofi A.  Gerilyn Pilgrim, M.D.  Electronically Signed     KAD/MEDQ  D:  12/31/2006  T:  12/31/2006  Job:  [id number]

## 2011-03-22 NOTE — Group Therapy Note (Signed)
NAME:  Desiree, Joyce              ACCOUNT NO.:  [id number]   MEDICAL RECORD NO.:  [id number]          PATIENT TYPE:  INP   LOCATION:  A329                          FACILITY:  [facility]   PHYSICIAN:  Margaretmary Dys, M.D.DATE OF BIRTH:  1921-10-04   DATE OF PROCEDURE:  12/28/2006  DATE OF DISCHARGE:                                 PROGRESS NOTE   SUBJECTIVE:  The patient seen still as initially nonresponsive in terms  of questions, although the patient was sitting at the edge of the bed  and mumbling incomprehensibly.  The patient overall remains stable.   OBJECTIVE:  Her vital signs are stable.  Her CNS exam shows some  weakness on the right side otherwise not quite remarkable.  Her  laboratory data was stable.   ASSESSMENT AND PLAN:  Generalized weakness with urinary tract infection.  This appears to be resolving.  Continue on antibiotic therapy.  Overall  the patient is stable.  She also has a pneumonia which has been treated.  Continue the Levaquin.      Margaretmary Dys, M.D.  Electronically Signed     AM/MEDQ  D:  12/28/2006  T:  12/29/2006  Job:  [id number]

## 2011-03-22 NOTE — Procedures (Signed)
   NAME:  Desiree Joyce, Desiree Joyce                        ACCOUNT NO.:  [id number]   MEDICAL RECORD NO.:  [id number]                   PATIENT TYPE:  INP   LOCATION:  A215                                 FACILITY:  [facility]   PHYSICIAN:  Maisie Fus C. Wall, M.D.                DATE OF BIRTH:  10-14-1921   DATE OF PROCEDURE:  DATE OF DISCHARGE:                                  ECHOCARDIOGRAM   PROCEDURE:  Echocardiogram.   INDICATION:  Atrial fibrillation.   TECHNICAL QUALITY OF STUDY:  The echocardiogram was technically adequate.   CONCLUSION:  1. Normal left and right ventricular size.  2. Normal left ventricular chamber size and overall systolic function.  3. Hyperdynamic left ventricular function with mild left-ventricular     hypertrophy.  4. Aortic valve sclerosis with minimal to mild aortic insufficiency.  5. No right-sided obstruction.  6. No pericardial effusion.      ___________________________________________                                            Jesse Sans Wall, M.D.   TCW/MEDQ  D:  09/07/2003  T:  09/07/2003  Job:  [id number]   cc:   Vilas Bing, M.D.

## 2011-03-22 NOTE — Discharge Summary (Signed)
NAME:  Desiree Joyce, Desiree Joyce                        ACCOUNT NO.:  [id number]   MEDICAL RECORD NO.:  [id number]                   PATIENT TYPE:  INP   LOCATION:  A215                                 FACILITY:  [facility]   PHYSICIAN:  Dirk Dress. Katrinka Blazing, M.D.                DATE OF BIRTH:  04-27-1921   DATE OF ADMISSION:  09/05/2003  DATE OF DISCHARGE:  09/08/2003                                 DISCHARGE SUMMARY   DISCHARGE DIAGNOSES:  1. Acute chest pain, myocardial ischemia ruled out.  2. Hypertension.  3. Peptic ulcer disease.  4. Osteoarthritis.  5. Chronic anxiety syndrome.  6. Depression.  7. Dementia.  8. History of stage IIA Hodgkin's lymphoma, status post treatment with no     evidence of recurrence.   DISPOSITION:  The patient is discharged to home in stable and satisfactory  condition.   DISCHARGE MEDICATIONS:  1. Altace 10 mg q.d.  2. Lasix 20 mg q.d. at 5 p.m., 20 mg two tablets q.d. in the a.m.  3. Xanax 1 mg t.i.d.  4. Aspirin 325 mg q.d.  5. Potassium chloride 10 mEq q.d.  6. Pepcid 20 mg q.d.  7. Hydralazine 10 mg q.d.  8. Lipitor 10 mg q.h.s.  9. Hemocyte one tablet q.d.  10.      Coreg 12.5 mg 1/2 tablet b.i.d.  11.      Imdur 60 mg one tablet q.d.   FOLLOW UP:  The patient will have followup with Dr. Loleta Chance in one week.   HOSPITAL COURSE:  This is an 77 year old female admitted for evaluation of  chest pain and neck pain.  She came to the office complaining of pain in her  upper mid chest with vague weakness.  She has a history of atherosclerotic  heart disease and a stress Cardiolite study on June 09, 2002, that revealed  no wall abnormalities or ischemia.  Because of her discomfort and her prior  history of heart disease, she was admitted.  General physical examination  was unremarkable.  The patient looked clinically depressed.  The initial EKG  and serial enzymes were  normal.  There was no rise in her troponin or CK-MB.  By November 3, she was  asymptomatic  except for mild, mid abdominal discomfort.  She remained  stable.  On November 4, she was asymptomatic.  She was discharged home to  have close followup with Dr. Loleta Chance.     ___________________________________________                                         Dirk Dress. Katrinka Blazing, M.D.   LCS/MEDQ  D:  11/06/2003  T:  11/07/2003  Job:  [id number]

## 2011-03-22 NOTE — H&P (Signed)
NAMEKAMILAH, Joyce              ACCOUNT NO.:  [id number]   MEDICAL RECORD NO.:  [id number]          PATIENT TYPE:  INP   LOCATION:  A215                          FACILITY:  [facility]   PHYSICIAN:  Margaretmary Dys, M.D.DATE OF BIRTH:  Mar 01, 1921   DATE OF ADMISSION:  12/21/2006  DATE OF DISCHARGE:  LH                              HISTORY & PHYSICAL   ADMISSION DIAGNOSES:  1. Generalized weakness.  2. Bibasilar pneumonia.  3. History of advanced dementia.  4. 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   CHIEF COMPLAINT:  Increasing weakness and shortness of breath over the  past week.   HISTORY OF PRESENT ILLNESS:  Desiree Joyce is an 77 year old female  resident of an adult home.  She has advanced dementia.  The patient is  unable to give me any history as she was not quite responsive.  Her  daughter, who is the power-of-attorney for health care, was present.  It  appears that last week on the 14th they called and said the patient was  not really eating well and was short of breath.  The patient was brought  to the emergency room and was found to have pneumonia on chest x-ray.  Subsequently was given Z-Pak and sent back to the adult home.  However,  over the past couple of days, she has progressively gotten weaker and  not eating.  She has had no fevers and have been having an occasional  cough.  She also became more lethargic than usual.  The daughters also  noticed that she was not moving much of her right side today.  She did  not have any headaches.  The patient is able to get around pretty much  by use of a walker.  It was not possible to obtain any other information  from her or the family.  There is no recent history of weight loss, no  diarrhea, no abdominal pain.   REVIEW OF SYSTEMS:  As mentioned above.  Difficult to get a  comprehensive review.   PAST MEDICAL HISTORY:  1. Congestive heart failure.  2. Hypertension.  3. Advanced Alzheimer's disease.  4. Anxiety.  5. History of cerebrovascular accident.  6. History of hypercholesterolemia.   FAMILY HISTORY:  Positive for coronary artery disease and hypertension.   SOCIAL HISTORY:  The patient does not smoke or drink alcohol.  No drug  abuse.   DRUG ALLERGIES:  No known drug allergies.   MEDICATIONS:  1. She is on Lasix 40 mg p.o. once a day.  2. Ecotrin 325 mg p.o. once a day.  3. Natal Care Plus 1 tablet once a day.  4. Potassium 10 mEq p.o. every morning.  5. Imdur 60 mg p.o. once a day.  6. Allegra D 180 mg p.o. once a day.  7. Flonase 2 sprays in each nostril once a day.  8. Lexapro 10 mg p.o. once a day.  9. Hemocyte 1 tablet p.o. once a day.   PHYSICAL EXAMINATION:  The patient was lethargic but conscious but not  responding to calls, not following commands.  She seemed to have a  hemiplegic appearance.  VITAL SIGNS:  On arrival in the emergency room, her blood pressure was  137/71, pulse was 67, respiration of 20, temperature is 97.6.  Oxygen  saturation was 92% on room air.  It was noted in the ED note per EMS her  oxygen saturation was 88% on room air when the EMS arrived at the [facility] where she lives.  She was placed on four liters and  oxygen went up to 96%.  HEENT:  Normocephalic and atraumatic.  The patient appeared to have a  right facial droop.  Oral mucosa was dry.  Neck was supple.  LUNGS:  Reduced air entry with occasional crackles at the bases.  No  rhonchi or wheezing was heard.  HEART:  S1-S2.  Occasional missed beats.  No S3, S4, gallops or rubs.  ABDOMEN:  Soft and nontender.  Bowel sounds positive.  No masses  palpable.  EXTREMITIES:  Bilateral pitting pedal edema with no calf induration or  tenderness noted.  CNS:  The patient was lethargic but conscious.  Not following commands.   The dictation ended at this point.      Margaretmary Dys, M.D.  Electronically Signed     AM/MEDQ  D:  12/21/2006  T:  12/22/2006   Job:  [id number]

## 2011-03-22 NOTE — Procedures (Signed)
NAMEGRACELAND, WACHTER              ACCOUNT NO.:  [id number]   MEDICAL RECORD NO.:  [id number]          PATIENT TYPE:  INP   LOCATION:  A215                          FACILITY:  [facility]   PHYSICIAN:  Gerrit Friends. Dietrich Pates, MD, FACCDATE OF BIRTH:  1921-08-25   DATE OF PROCEDURE:  12/23/2006  DATE OF DISCHARGE:                                ECHOCARDIOGRAM   ADDENDUM:  Comparison with prior study of September 07, 2003:  The  inferior posterior segmental wall motion abnormality is new.      Gerrit Friends. Dietrich Pates, MD, [facility]  Electronically Signed     RMR/MEDQ  D:  12/23/2006  T:  12/24/2006  Job:  [id number]
